# Patient Record
Sex: Female | Born: 1950
Health system: Southern US, Community
[De-identification: ages and names within clinical notes are randomized; demographics above are authoritative.]

## PROBLEM LIST (undated history)

## (undated) DIAGNOSIS — F32A Depression, unspecified: Secondary | ICD-10-CM

## (undated) DIAGNOSIS — M1990 Unspecified osteoarthritis, unspecified site: Secondary | ICD-10-CM

## (undated) DIAGNOSIS — F329 Major depressive disorder, single episode, unspecified: Secondary | ICD-10-CM

## (undated) HISTORY — DX: Depression, unspecified: F32.A

## (undated) HISTORY — DX: Major depressive disorder, single episode, unspecified: F32.9

## (undated) HISTORY — PX: CARPAL TUNNEL RELEASE: SHX101

## (undated) HISTORY — DX: Unspecified osteoarthritis, unspecified site: M19.90

---

## 1994-06-15 HISTORY — PX: ABDOMINAL HYSTERECTOMY: SHX81

## 2009-04-16 ENCOUNTER — Encounter: Payer: Self-pay | Admitting: Family Medicine

## 2009-05-01 ENCOUNTER — Encounter: Payer: Self-pay | Admitting: Family Medicine

## 2010-07-16 ENCOUNTER — Encounter: Payer: Self-pay | Admitting: Emergency Medicine

## 2010-07-16 ENCOUNTER — Ambulatory Visit (INDEPENDENT_AMBULATORY_CARE_PROVIDER_SITE_OTHER): Payer: Medicare Other | Admitting: Emergency Medicine

## 2010-07-16 DIAGNOSIS — J069 Acute upper respiratory infection, unspecified: Secondary | ICD-10-CM

## 2010-07-16 DIAGNOSIS — R5381 Other malaise: Secondary | ICD-10-CM

## 2010-07-16 DIAGNOSIS — R5383 Other fatigue: Secondary | ICD-10-CM | POA: Insufficient documentation

## 2010-07-16 LAB — CONVERTED CEMR LAB
ALT: 17 units/L (ref 0–35)
AST: 25 units/L (ref 0–37)
Basophils Absolute: 0.1 10*3/uL (ref 0.0–0.1)
Basophils Relative: 1 % (ref 0–1)
Creatinine, Ser: 0.69 mg/dL (ref 0.40–1.20)
Eosinophils Relative: 2 % (ref 0–5)
Hemoglobin: 13.4 g/dL (ref 12.0–15.0)
Lymphocytes Relative: 29 % (ref 12–46)
MCHC: 32.5 g/dL (ref 30.0–36.0)
Monocytes Absolute: 0.5 10*3/uL (ref 0.1–1.0)
Neutro Abs: 3.5 10*3/uL (ref 1.7–7.7)
Platelets: 137 10*3/uL — ABNORMAL LOW (ref 150–400)
RDW: 13.6 % (ref 11.5–15.5)
Sodium: 141 meq/L (ref 135–145)
Total Bilirubin: 0.3 mg/dL (ref 0.3–1.2)
Total Protein: 7.6 g/dL (ref 6.0–8.3)

## 2010-07-18 ENCOUNTER — Telehealth (INDEPENDENT_AMBULATORY_CARE_PROVIDER_SITE_OTHER): Payer: Self-pay | Admitting: *Deleted

## 2010-07-23 NOTE — Progress Notes (Signed)
  Phone Note Outgoing Call Call back at [facility] [phone]   Call placed by: Lajean Saver RN,  July 18, 2010 1:17 PM Call placed to: Patient Action Taken: Phone Call Completed Summary of Call: Callback: Patient reports she is feeling a lot better and is finishin g her antibiotics. I gave her her lab results

## 2010-07-23 NOTE — Assessment & Plan Note (Signed)
Summary: SICK, LOW GRADE FEVER/WB room 3   Vital Signs:  Patient profile:   60 year old female Height:      65 inches Weight:      296 pounds O2 Sat:      94 % on Room air Temp:     97.7 degrees F oral Pulse rate:   74 / minute Resp:     16 per minute BP sitting:   153 / 84  (left arm) Cuff size:   large  Vitals Entered By: Clemens Catholic LPN (July 16, 2010 11:15 AM)  O2 Flow:  Room air History of Present Illness History from: patient Chief Complaint: fatigue, URI symptoms History of Present Illness: 60 Years Old Female complains of onset of cold symptoms for 4-5 days.  [facility] has been using Goody powder which is helping a little bit. She doesn't have a PCP.  Her husband and sister died last year from lung CA and every time she gets sick, she gets anxious and is afraid to die.  She also has RA but doesn't take any meds.  She also c/o recent ringing in her L ear. + sore throat No cough No pleuritic pain No wheezing No nasal congestion + post-nasal drainage + sinus pain/pressure No chest congestion No itchy/red eyes No earache No hemoptysis No SOB + chills/sweats No fever No nausea No vomiting No abdominal pain No diarrhea No skin rashes + fatigue + myalgias No headache     Preventive Screening-Counseling & Management  Alcohol-Tobacco     Smoking Status: never      Drug Use:  no.    Current Medications (verified): 1)  None  Past History:  Past Medical History: RA  Past Surgical History: Caesarean section Carpal tunnel release Hysterectomy  Family History: RA  Diabetes  Social History: Never Smoked Alcohol use-no Drug use-no Smoking Status:  never Drug Use:  no  Physical Exam  General:  Well-developed,obese, using a cane; alert,appropriate and cooperative throughout examination Ears:  External ear exam shows no significant lesions or deformities.  Otoscopic examination reveals clear canals, tympanic membranes are intact bilaterally  without bulging, retraction, inflammation or discharge. Hearing is grossly normal bilaterally. Nose:  External nasal examination shows no deformity or inflammation. Nasal mucosa are pink and moist without lesions or exudates. Mouth:  Oral mucosa and oropharynx without lesions or exudates.  Teeth in good repair. Lungs:  Normal respiratory effort, chest expands symmetrically. Lungs are clear to auscultation, no crackles or wheezes. Heart:  Normal rate and regular rhythm. S1 and S2 normal without gallop, murmur, click, rub or other extra sounds. Psych:  Cognition and judgment appear intact. Alert and cooperative with normal attention span and concentration. No apparent delusions, illusions, hallucinations.  Occasionally during the exam, she almost becomes tearful when talking about her dead family members   Impression & Recommendations:  Problem # 1:  UPPER RESPIRATORY INFECTION (ICD-465.9) Rx for Amoxicillin.  Rest, hydration. I would like her to switch from [employer] (since I think it may be giving her tinnitus) to perhaps Mobic, but I will leave this to the discretion of her PCP.  We have scheduled an appt to establish at [facility]  Problem # 2:  FATIGUE (ICD-780.79) Will check labs.  With her body habitus, I am concerned with diabetes.  Likely also due to stress, anxiety, and URI.  Patient to follow up with PCP for lab results or if getting worse symptoms in the meantime. Orders: T-Comprehensive Metabolic Panel [id number])  T-CBC w/Diff [id number]) T-TSH [id number]) T-Hgb A1C ([id number])  Complete Medication List: 1)  Amoxicillin 875 Mg Tabs (Amoxicillin) .Marland Kitchen.. 1 tab by mouth two times a day for 7 days Prescriptions: AMOXICILLIN 875 MG TABS (AMOXICILLIN) 1 tab by mouth two times a day for 7 days  #14 x 0   Entered and Authorized by:   Hoyt Koch MD   Signed by:   Hoyt Koch MD on 07/16/2010   Method used:   Print then Give to Patient   RxID:    [id number]    Orders Added: 1)  New Patient Level III [44010] 2)  T-Comprehensive Metabolic Panel [[id number]] 3)  T-CBC w/Diff [[id number]] 4)  T-TSH [[id number]] 5)  T-Hgb A1C [83036-23375]

## 2010-07-24 ENCOUNTER — Other Ambulatory Visit: Payer: Self-pay | Admitting: Family Medicine

## 2010-07-24 ENCOUNTER — Ambulatory Visit (INDEPENDENT_AMBULATORY_CARE_PROVIDER_SITE_OTHER): Payer: Medicare Other | Admitting: Family Medicine

## 2010-07-24 ENCOUNTER — Ambulatory Visit
Admission: RE | Admit: 2010-07-24 | Discharge: 2010-07-24 | Disposition: A | Discharge status: Home / Self Care | Payer: Medicare Other | Source: Ambulatory Visit | Attending: Family Medicine | Admitting: Family Medicine

## 2010-07-24 ENCOUNTER — Encounter: Payer: Self-pay | Admitting: Family Medicine

## 2010-07-24 DIAGNOSIS — M2550 Pain in unspecified joint: Secondary | ICD-10-CM

## 2010-07-24 DIAGNOSIS — M25561 Pain in right knee: Secondary | ICD-10-CM

## 2010-07-24 DIAGNOSIS — M25569 Pain in unspecified knee: Secondary | ICD-10-CM

## 2010-07-24 DIAGNOSIS — F339 Major depressive disorder, recurrent, unspecified: Secondary | ICD-10-CM | POA: Insufficient documentation

## 2010-07-24 DIAGNOSIS — F329 Major depressive disorder, single episode, unspecified: Secondary | ICD-10-CM

## 2010-07-24 DIAGNOSIS — F331 Major depressive disorder, recurrent, moderate: Secondary | ICD-10-CM | POA: Insufficient documentation

## 2010-07-31 ENCOUNTER — Encounter: Payer: Self-pay | Admitting: Family Medicine

## 2010-07-31 NOTE — Assessment & Plan Note (Signed)
Summary: NOV: joint pain, right knee pain, new diagnosis depression   Vital Signs:  Patient profile:   60 year old female Height:      65 inches Weight:      302 pounds Pulse rate:   61 / minute BP sitting:   134 / 66  (right arm) Cuff size:   large  Vitals Entered By: Avon Gully CMA, Duncan Dull) (July 24, 2010 2:09 PM) CC: NP-est care   CC:  NP-est care.  History of Present Illness: Was on meloxicam (inc her BP), then IBU adn din't help her pain.  Han't been on anything for over a year. Using OTC meds now.  Mother with hx of RA and sister with RA.  Right hip and right knee pain.       Going through menopause.  Hx of complete hysterectomy.  Hot flashes, anxiety, Not sleeping well, irritable and depressed alot.  Tooks HRT for about a year.    Husban passed away last year. has alot of family support. He died from Lung Cancer.    Has been trying to loose weight.   Habits & Providers  Alcohol-Tobacco-Diet     Alcohol drinks/day: 0     Tobacco Status: quit     Year Quit: 2008  Exercise-Depression-Behavior     Does Patient Exercise: no     STD Risk: never     Drug Use: never     Seat Belt Use: always  Current Medications (verified): 1)  Aleve 220 Mg Tabs (Naproxen Sodium)  Allergies (verified): No Known Drug Allergies  Comments:  Nurse/Medical Assistant: The patient's medications and allergies were reviewed with the patient and were updated in the Medication and Allergy Lists. Avon Gully CMA, Duncan Dull) (July 24, 2010 2:11 PM)  Past History:  Past Surgical History: Caesarean section x 2  Carpal tunnel release, right and left.  Hysterectomy 1996 for fibroids   Social History: Reitred Optometrist.  Alcohol use-no Drug use-no Former Smoker, smoked for 2 years, 1ppd  Smoking Status:  quit Does Patient Exercise:  no STD Risk:  never Drug Use:  never Risk analyst Use:  always  Physical Exam  General:  Well-developed,well-nourished,in no acute  distress; alert,appropriate and cooperative throughout examination Head:  Normocephalic and atraumatic without obvious abnormalities. No apparent alopecia or balding. Neck:  No deformities, masses, or tenderness noted. Lungs:  Normal respiratory effort, chest expands symmetrically. Lungs are clear to auscultation, no crackles or wheezes. Heart:  Normal rate and regular rhythm. S1 and S2 normal without gallop, murmur, click, rub or other extra sounds. Msk:  noted I did not examine her right knee today. Skin:  no rashes.   Cervical Nodes:  No lymphadenopathy noted Psych:  Cognition and judgment appear intact. Alert and cooperative with normal attention span and concentration. No apparent delusions, illusions, hallucinations   Impression & Recommendations:  Problem # 1:  POLYARTHRITIS (ICD-719.49) I am not sure that she actually has rheumatoid arthritis based on her history.  She mostly complains of her right knee and right hip.  She said previously she was told the cartilage which was worn out.  She was supposed to follow-up with the orthopedist but the week of her appointment she found out she was losing her job at the point that she worked out at the time. she does not have any swelling or significant pain today in her right knee the is a little sore.  We will get an x-ray of the right knee today  and refer her to orthopedics for further evaluation.  Certainly we can also check a rheumatoid factor and a sed rate at the suspicion is low for rheumatoid as she has no complaints of joint pain or swelling or redness in her hands.  Problem # 2:  KNEE PAIN, RIGHT (ICD-719.46) will get an x-ray today and refer to orthopedics for further evaluation.  She may be a candidate for injections.  Also send of her prescription for Relafen for pain relief since the counter products are not enough. Her updated medication list for this problem includes:    Aleve 220 Mg Tabs (Naproxen sodium)    Nabumetone 500 Mg Tabs  (Nabumetone) .Marland Kitchen... Take 1 tablet by mouth two times a day as needed for joint pain  Orders: T-DG Knee 2 Views*R* (54098) Orthopedic Referral (Ortho)  Problem # 3:  DEPRESSION (ICD-311) PHQ-9 score of 21 (severe).  discussed her diagnosis and I recommended a medication in addition to counseling or therapy.  She feels her family is very supportive but is interested in trying medication for symptom relief.  I explained that she will still agree for her husband and sister but were trying to improve her sleep quality and her irritability and overalldepression.  Restart with citalopram and follow-up in 3 weeks.  Discussed potential side effects.  Call with any concerns or thoughts of hurting herself.  She currently denies any suicidal thoughts. Her updated medication list for this problem includes:    Citalopram Hydrobromide 20 Mg Tabs (Citalopram hydrobromide) .Marland Kitchen... 1/2 tab by mouth daily for the first week then inc to whole tab daily  Complete Medication List: 1)  Aleve 220 Mg Tabs (Naproxen sodium) 2)  Citalopram Hydrobromide 20 Mg Tabs (Citalopram hydrobromide) .... 1/2 tab by mouth daily for the first week then inc to whole tab daily 3)  Nabumetone 500 Mg Tabs (Nabumetone) .... Take 1 tablet by mouth two times a day as needed for joint pain   Patient Instructions: 1)  We will call you with your xray results.  2)  Please schedule a follow-up appointment in 3 weeks for mood.  Prescriptions: NABUMETONE 500 MG TABS (NABUMETONE) Take 1 tablet by mouth two times a day as needed for joint pain  #60 x 1   Entered and Authorized by:   Nani Gasser MD   Signed by:   Nani Gasser MD on 07/25/2010   Method used:   Electronically to        [facility] [phone]* (retail)       [address]       [place], [place]  [address]       Ph: [phone]       Fax: [phone]   RxID:   [id number] CITALOPRAM HYDROBROMIDE 20 MG TABS (CITALOPRAM HYDROBROMIDE) 1/2 tab by mouth daily for the  first week then inc to whole tab daily  #30 x 0   Entered and Authorized by:   Nani Gasser MD   Signed by:   Nani Gasser MD on 07/24/2010   Method used:   Electronically to        [facility] [phone]* (retail)       [address]       [place], [place]  [address]       Ph: [phone]       Fax: [phone]   RxID:   [id number]    Orders Added: 1)  T-DG Knee 2 Views*R* [73560] 2)  Orthopedic Referral [Ortho] 3)  New Patient Level III [99203]  Appended Document: NOV: joint pain, right knee pain, new diagnosis depression Append HPI: Sister also passed away this year. She has been strugglign with her mood. Has not been sleeping well. Has been more irritable. Even her kids have noticed her being less patient. Says she is really mouring their loss. Tearful at times.

## 2010-08-12 NOTE — Letter (Signed)
Summary: Intake Forms  Intake Forms   Imported By: Lanelle Bal 08/07/2010 11:33:49  _____________________________________________________________________  External Attachment:    Type:   Image     Comment:   External Document

## 2010-08-12 NOTE — Letter (Signed)
Summary: Patient Health Questionnaire  Patient Health Questionnaire   Imported By: Maryln Gottron 08/04/2010 13:50:28  _____________________________________________________________________  External Attachment:    Type:   Image     Comment:   External Document

## 2010-08-14 HISTORY — PX: TOTAL KNEE ARTHROPLASTY: SHX125

## 2010-08-18 ENCOUNTER — Ambulatory Visit (HOSPITAL_COMMUNITY)
Admission: RE | Admit: 2010-08-18 | Discharge: 2010-08-18 | Disposition: A | Discharge status: Home / Self Care | Payer: Medicare Other | Source: Ambulatory Visit | Attending: Orthopedic Surgery | Admitting: Orthopedic Surgery

## 2010-08-18 ENCOUNTER — Encounter (HOSPITAL_COMMUNITY)
Admission: RE | Admit: 2010-08-18 | Discharge: 2010-08-18 | Disposition: A | Discharge status: Home / Self Care | Payer: Medicare Other | Source: Ambulatory Visit | Attending: Orthopedic Surgery | Admitting: Orthopedic Surgery

## 2010-08-18 ENCOUNTER — Other Ambulatory Visit (HOSPITAL_COMMUNITY): Payer: Self-pay | Admitting: Orthopedic Surgery

## 2010-08-18 DIAGNOSIS — M1711 Unilateral primary osteoarthritis, right knee: Secondary | ICD-10-CM

## 2010-08-18 DIAGNOSIS — Z01812 Encounter for preprocedural laboratory examination: Secondary | ICD-10-CM | POA: Insufficient documentation

## 2010-08-18 DIAGNOSIS — IMO0002 Reserved for concepts with insufficient information to code with codable children: Secondary | ICD-10-CM | POA: Insufficient documentation

## 2010-08-18 DIAGNOSIS — Z01818 Encounter for other preprocedural examination: Secondary | ICD-10-CM | POA: Insufficient documentation

## 2010-08-18 DIAGNOSIS — M1710 Unilateral primary osteoarthritis, unspecified knee: Secondary | ICD-10-CM | POA: Insufficient documentation

## 2010-08-18 DIAGNOSIS — Z01810 Encounter for preprocedural cardiovascular examination: Secondary | ICD-10-CM | POA: Insufficient documentation

## 2010-08-18 DIAGNOSIS — I517 Cardiomegaly: Secondary | ICD-10-CM | POA: Insufficient documentation

## 2010-08-18 LAB — COMPREHENSIVE METABOLIC PANEL
ALT: 24 U/L (ref 0–35)
AST: 25 U/L (ref 0–37)
Albumin: 4.1 g/dL (ref 3.5–5.2)
Alkaline Phosphatase: 74 U/L (ref 39–117)
CO2: 24 mEq/L (ref 19–32)
Chloride: 105 mEq/L (ref 96–112)
Creatinine, Ser: 0.82 mg/dL (ref 0.4–1.2)
GFR calc Af Amer: >60 mL/min (ref >60)
GFR calc non Af Amer: >60 mL/min (ref >60)
Potassium: 3.6 mEq/L (ref 3.5–5.1)
Sodium: 140 mEq/L (ref 135–145)
Total Bilirubin: 0.4 mg/dL (ref 0.3–1.2)

## 2010-08-18 LAB — CBC
HCT: 43.2 % (ref 36.0–46.0)
Hemoglobin: 14.8 g/dL (ref 12.0–15.0)
MCH: 31.6 pg (ref 26.0–34.0)
MCHC: 34.3 g/dL (ref 30.0–36.0)
MCV: 92.1 fL (ref 78.0–100.0)
RBC: 4.69 MIL/uL (ref 3.87–5.11)

## 2010-08-18 LAB — APTT: aPTT: 38 seconds — ABNORMAL HIGH (ref 24–37)

## 2010-08-20 ENCOUNTER — Inpatient Hospital Stay (HOSPITAL_COMMUNITY): Payer: Medicare Other

## 2010-08-20 ENCOUNTER — Inpatient Hospital Stay (HOSPITAL_COMMUNITY)
Admission: RE | Admit: 2010-08-20 | Discharge: 2010-08-24 | DRG: 470 | Disposition: A | Discharge status: Home Health | Payer: Medicare Other | Source: Ambulatory Visit | Attending: Orthopedic Surgery | Admitting: Orthopedic Surgery

## 2010-08-20 DIAGNOSIS — K219 Gastro-esophageal reflux disease without esophagitis: Secondary | ICD-10-CM | POA: Diagnosis present

## 2010-08-20 DIAGNOSIS — Z87891 Personal history of nicotine dependence: Secondary | ICD-10-CM

## 2010-08-20 DIAGNOSIS — F3289 Other specified depressive episodes: Secondary | ICD-10-CM | POA: Diagnosis present

## 2010-08-20 DIAGNOSIS — R079 Chest pain, unspecified: Secondary | ICD-10-CM

## 2010-08-20 DIAGNOSIS — M1710 Unilateral primary osteoarthritis, unspecified knee: Principal | ICD-10-CM | POA: Diagnosis present

## 2010-08-20 DIAGNOSIS — F329 Major depressive disorder, single episode, unspecified: Secondary | ICD-10-CM | POA: Diagnosis present

## 2010-08-20 LAB — TROPONIN I: Troponin I: 0.03 ng/mL (ref 0.00–0.06)

## 2010-08-20 LAB — TYPE AND SCREEN: ABO/RH(D): O NEG

## 2010-08-20 LAB — CK TOTAL AND CKMB (NOT AT ARMC)
Relative Index: 1.5 (ref 0.0–2.5)
Total CK: 218 U/L — ABNORMAL HIGH (ref 7–177)

## 2010-08-20 LAB — CARDIAC PANEL(CRET KIN+CKTOT+MB+TROPI): Relative Index: 2 (ref 0.0–2.5)

## 2010-08-20 LAB — ABO/RH: ABO/RH(D): O NEG

## 2010-08-21 DIAGNOSIS — R079 Chest pain, unspecified: Secondary | ICD-10-CM

## 2010-08-21 LAB — CARDIAC PANEL(CRET KIN+CKTOT+MB+TROPI)
Relative Index: 1.5 (ref 0.0–2.5)
Total CK: 200 U/L — ABNORMAL HIGH (ref 7–177)
Troponin I: 0.03 ng/mL (ref 0.00–0.06)

## 2010-08-21 LAB — PROTIME-INR: INR: 1.12 (ref 0.00–1.49)

## 2010-08-21 LAB — BASIC METABOLIC PANEL
CO2: 26 mEq/L (ref 19–32)
Chloride: 102 mEq/L (ref 96–112)
GFR calc Af Amer: >60 mL/min (ref >60)
Potassium: 3.8 mEq/L (ref 3.5–5.1)

## 2010-08-21 LAB — CBC
HCT: 27.4 % — ABNORMAL LOW (ref 36.0–46.0)
Hemoglobin: 9.2 g/dL — ABNORMAL LOW (ref 12.0–15.0)
MCHC: 33.6 g/dL (ref 30.0–36.0)
MCV: 93.5 fL (ref 78.0–100.0)

## 2010-08-21 NOTE — Consult Note (Signed)
Summary: [facility]   Imported By: Lanelle Bal 08/15/2010 09:36:06  _____________________________________________________________________  External Attachment:    Type:   Image     Comment:   External Document

## 2010-08-22 LAB — PROTIME-INR
INR: 1.6 — ABNORMAL HIGH (ref 0.00–1.49)
Prothrombin Time: 19.2 seconds — ABNORMAL HIGH (ref 11.6–15.2)

## 2010-08-22 LAB — BASIC METABOLIC PANEL
Calcium: 7.9 mg/dL — ABNORMAL LOW (ref 8.4–10.5)
Creatinine, Ser: 0.73 mg/dL (ref 0.4–1.2)
GFR calc Af Amer: >60 mL/min (ref >60)
GFR calc non Af Amer: >60 mL/min (ref >60)

## 2010-08-22 LAB — CBC
Platelets: 102 10*3/uL — ABNORMAL LOW (ref 150–400)
RDW: 13.2 % (ref 11.5–15.5)
WBC: 8.5 10*3/uL (ref 4.0–10.5)

## 2010-08-23 LAB — CBC
MCH: 30.1 pg (ref 26.0–34.0)
Platelets: 106 10*3/uL — ABNORMAL LOW (ref 150–400)
RBC: 2.66 MIL/uL — ABNORMAL LOW (ref 3.87–5.11)
WBC: 7.6 10*3/uL (ref 4.0–10.5)

## 2010-08-23 LAB — BASIC METABOLIC PANEL
Calcium: 8.3 mg/dL — ABNORMAL LOW (ref 8.4–10.5)
GFR calc Af Amer: >60 mL/min (ref >60)
GFR calc non Af Amer: >60 mL/min (ref >60)
Glucose, Bld: 135 mg/dL — ABNORMAL HIGH (ref 70–99)
Sodium: 138 mEq/L (ref 135–145)

## 2010-08-23 LAB — PROTIME-INR: Prothrombin Time: 22.5 seconds — ABNORMAL HIGH (ref 11.6–15.2)

## 2010-08-24 LAB — CBC
Hemoglobin: 7.7 g/dL — ABNORMAL LOW (ref 12.0–15.0)
MCHC: 32.8 g/dL (ref 30.0–36.0)

## 2010-08-24 LAB — PROTIME-INR
INR: 1.87 — ABNORMAL HIGH (ref 0.00–1.49)
Prothrombin Time: 21.7 seconds — ABNORMAL HIGH (ref 11.6–15.2)

## 2010-08-31 ENCOUNTER — Encounter: Payer: Self-pay | Admitting: Family Medicine

## 2010-08-31 NOTE — Op Note (Signed)
Terri Farrell, Terri Farrell               ACCOUNT NO.:  [id number]  MEDICAL RECORD NO.:  [id number]           PATIENT TYPE:  I  LOCATION:  3311                         FACILITY:  [facility]  PHYSICIAN:  Nadara Mustard, MD     DATE OF BIRTH:  05-07-1951  DATE OF PROCEDURE: DATE OF DISCHARGE:                              OPERATIVE REPORT   PREOPERATIVE DIAGNOSIS:  Osteoarthritis, right knee.  POSTOPERATIVE DIAGNOSIS:  Osteoarthritis, right knee.  PROCEDURE:  Right total knee arthroplasty with Zimmer components, size E femur, 5 tibia, 20-mm poly tray, 32-mm patella.  SURGEON:  Nadara Mustard, MD  ANESTHESIA:  General plus femoral block.  ESTIMATED BLOOD LOSS:  Minimal.  ANTIBIOTICS:  Kefzol 2 g.  DRAINS:  None.  COMPLICATIONS:  None.  TOURNIQUET TIME:  None.  DISPOSITION:  To PACU in stable condition.  INDICATIONS FOR PROCEDURE:  The patient is a 60 year old woman with osteoarthritis of her right knee who presents at this time for total knee arthroplasty.  The patient has pain with activities of daily living.  She has failed conservative care.  Risks and benefits of surgery were discussed including infection, neurovascular injury, persistent pain, DVT, pulmonary embolus, need for additional surgery. The patient states she understands and wish proceed at this time.  The patient has a BMI greater than 50 and extra time was needed to perform the procedure due to the increased BMI.  DESCRIPTION OF PROCEDURE:  The patient was brought to OR room 10 and underwent a general anesthetic.  After adequate level of the anesthesia was obtained, the patient's right lower extremity was prepped using DuraPrep, draped in a sterile field.  An Collier Flowers was used to cover all exposed skin.  A medial incision was made.  This was carried down with a medial parapatellar retinacular incision.  The femur was drilled.  IM guide was used, set for 3 degrees of valgus and 12 mm was taken off the distal femur  due to the need to resect part of the lateral femoral condyle.  This was then measured for a size E and then chamfer cuts were made for the size E femur.  Attention was focused on the tibia.  This was set for 7 degrees posterior slope, neutral varus and valgus.  The cutting block was placed and this was set to take 10 mm off the medial tibial plateau barely resecting any bone from the lateral tibial plateau.  This then sized for a size 5, and the drill hole was made for the small stem.  Attention was then focused back on the femur.  The femoral trial was placed.  Box cut was made for the femur.  The femoral trial and tibia was then tried and this had stable full extension with stable varus and valgus with a 20-mm poly tray.  The lug cuts were drilled for the femur.  The patella was resurfaced and 10 mm was taken off the patella, sized for a 32, and the lug cuts were made for the 32 patella.  The knee was irrigated with pulsatile lavage, 3 L, total of 60 mL was injected into the  popliteal fossa.  Care was taken not to make an intervascular injection.  The wound was again irrigated with pulsatile lavage.  The tibial component was cemented followed by the femoral component.  Loose cement was removed.  The poly tray was placed and screwed in place and properly torqued.  The knee was left in extension. The patella was placed and the clamp was placed in the patella until the cement had hardened.  After the cement had hardened, knee was placed through a full range of motion, the patella tracked in midline.  The retinacular incision was closed using #1 Vicryl, subcu was closed using 0 Vicryl, skin was closed using approximating staples.  Wound was covered Adaptic orthopedic sponges, ABD dressing, Webril, and Coban. The patient was extubated and taken to PACU in stable condition.     Nadara Mustard, MD     MVD/MEDQ  D:  08/20/2010  T:  08/20/2010  Job:  [id number]  Electronically Signed by  Aldean Baker MD on 08/31/2010 04:34:06 PM

## 2010-09-03 ENCOUNTER — Other Ambulatory Visit: Payer: Self-pay | Admitting: Orthopedic Surgery

## 2010-09-03 DIAGNOSIS — Z96659 Presence of unspecified artificial knee joint: Secondary | ICD-10-CM

## 2010-09-03 NOTE — Consult Note (Signed)
Terri Farrell, Terri Farrell               ACCOUNT NO.:  [id number]  MEDICAL RECORD NO.:  [id number]           PATIENT TYPE:  I  LOCATION:  3311                         FACILITY:  [facility]  PHYSICIAN:  Terri Can. Abisai Farrell, M.D.DATE OF BIRTH:  1950/09/29  DATE OF CONSULTATION:  08/20/2010 DATE OF DISCHARGE:                                CONSULTATION   PRIMARY CARDIOLOGIST:  New patient to [facility], currently being evaluated by Dr. Delainee Farrell.  PRIMARY CARE PHYSICIAN:  Nani Gasser, MD  REASON FOR CONSULTATION:  Chest pain.  HISTORY OF PRESENT ILLNESS:  This is a 60 year old female who we were asked to evaluate in the PACU who is status post right total knee repair with complaints of chest pain postoperatively.  The patient states that she feels like she needs to belch.  She states that the feeling comes on intermittently and lasts several seconds and resolves on its own.  The pain is located under her left breast.  She denies any radiation.  She denies any nausea, vomiting, diaphoresis, or shortness of breath.  She states she has had the pain previously that she associates with acid reflux after she eats certain foods.  The patient states prior to the operation she was not very mobile, but states she could do her day-to- day activities without difficulty.  She denies any increased dyspnea, orthopnea, or PND.  She does state she has bilateral lower extremity edema occasionally.  The patient denies any history of hypertension, hyperlipidemia, or diabetes mellitus.  She denies any prior cardiac workup.  Her EKG does show anterolateral T-wave inversions.  During evaluation, the patient is currently pain free.  Cardiac markers are pending.  Cardiology was asked to evaluate the patient further.  PAST MEDICAL HISTORY: 1. Osteoarthritis. 2. Depression. 3. Gastroesophageal reflux disease. 4. Status post carpal tunnel bilaterally. 5. Status post C-section x2.  SOCIAL HISTORY:  The  patient is a retired Consulting civil engineer.  She is widowed within the last year.  She has 2 children.  She has a 20-pack- year smoking history, but quit approximately 4 years ago.  She denies any alcohol or illicit drug use.  FAMILY HISTORY:  Noncontributory for early coronary artery disease, although her father has started to have heart problems in his 24s.  She has siblings with diabetes mellitus.  ALLERGIES:  No known drug allergies.  MEDICATIONS: 1. Citalopram 20 mg daily. 2. Nabumetone 500 mg twice daily as needed for pain.  REVIEW OF SYSTEMS:  All pertinent positives and negatives as well as right knee pain as stated in the HPI.  All other systems have been reviewed and are negative.  PHYSICAL EXAMINATION:  VITAL SIGNS:  Pulse 61, respirations 15, blood pressure 113/64, and O2 saturation 97% on 2 liters. GENERAL:  This is a polite, well-developed, well-nourished, middle-aged female.  She is in no acute distress. HEENT:  Normal. NECK:  Supple without JVD. HEART:  Regular rate and rhythm with S1 and S2.  No murmur, rub, or gallop noted.  PMI is normal.  No tenderness to palpation.  Pulses are 2+ and equal bilaterally. LUNGS:  Clear to auscultation anteriorly without  wheezes, rales, or rhonchi. ABDOMEN:  Soft, obese, and nontender.  Positive bowel sounds x4. EXTREMITIES:  No clubbing, cyanosis, or edema.  There is right lower extremity in a brace. MUSCULOSKELETAL:  No joint deformities or effusions. NEURO:  Alert and oriented x3.  Cranial nerves II-XII grossly intact.  EKG showing sinus bradycardia at a rate of 54 beats per minute.  There is anterolateral T-wave inversions that are new from prior tracing.  LABORATORY DATA:  Pending.  ASSESSMENT/PLAN:  This is a 60 year old female who is status post right total knee who presents postoperatively with chest pain and new anterolateral T-wave changes.  The patient is morbidly obese, but denies hypertension, hyperlipidemia, or  diabetes mellitus.  With her EKG changes, the patient will be ruled out for myocardial infarction.  She will be transferred to the [facility] for overnight duration.  We will check serial enzymes and EKG.  We will add aspirin.  We will also start nitroglycerin drip. Currently, we will hold initiation of heparin, pending ortho recommendations as well as cardiac marker results.  At this time, we will also hold off on any beta-blocker initiation as the patient is bradycardic. Further treatments will be dependent upon the above results.     Terri Monarch, PA-C   ______________________________ Terri Farrell, M.D.    NB/MEDQ  D:  08/20/2010  T:  08/21/2010  Job:  [id number]  Electronically Signed by Alen Blew P.A. on 08/21/2010 06:07:14 PM Electronically Signed by Roger Shelter M.D. on 09/03/2010 04:16:47 PM

## 2010-09-04 ENCOUNTER — Ambulatory Visit
Admission: RE | Admit: 2010-09-04 | Discharge: 2010-09-04 | Disposition: A | Discharge status: Home / Self Care | Payer: Medicare Other | Source: Ambulatory Visit | Attending: Orthopedic Surgery | Admitting: Orthopedic Surgery

## 2010-09-04 ENCOUNTER — Ambulatory Visit (INDEPENDENT_AMBULATORY_CARE_PROVIDER_SITE_OTHER): Payer: Medicare Other | Admitting: Family Medicine

## 2010-09-04 ENCOUNTER — Encounter: Payer: Self-pay | Admitting: Family Medicine

## 2010-09-04 DIAGNOSIS — Z96659 Presence of unspecified artificial knee joint: Secondary | ICD-10-CM

## 2010-09-04 DIAGNOSIS — F329 Major depressive disorder, single episode, unspecified: Secondary | ICD-10-CM

## 2010-09-04 DIAGNOSIS — D649 Anemia, unspecified: Secondary | ICD-10-CM

## 2010-09-04 DIAGNOSIS — R5383 Other fatigue: Secondary | ICD-10-CM

## 2010-09-04 NOTE — Assessment & Plan Note (Signed)
Almost completed iron. Will recheck CBC and iron level next week.

## 2010-09-04 NOTE — Patient Instructions (Signed)
Go to lab next week to recheck anemia and iron levels.  Call me if you feel your mood is declining.

## 2010-09-04 NOTE — Assessment & Plan Note (Addendum)
PHQ-9 score of 4.  She is not on the citalopram for 2.5 weeks. She is doing fantastic. I think her depression has really improved since her surgery.  Asked her to call if any sign of decline in mood

## 2010-09-04 NOTE — Progress Notes (Signed)
  Subjective:    Patient ID: Terri Farrell, female    DOB: 10/26/1950, 59 y.o.   MRN: [id number]  HPI Roger Shelter well post surgery.  No complications. Painis much better. Doing PT.  Mood has been good. Ws anemic post surgery so was startedon iron. Only has 2 days more to take and will complete her rx given by ortho.     Review of Systems     Objective:   Physical Exam  Cardiovascular: Normal rate, regular rhythm and normal heart sounds.   Pulmonary/Chest: Effort normal and breath sounds normal. No respiratory distress. She has no wheezes.  She is using a cane to walk today.      BP 112/63  Pulse 69  Ht 5\' 5"  (1.651 m)  Wt 304 lb (137.893 kg)  BMI 50.59 kg/m2    Assessment & Plan:  1. Anemia post surgery. Complete iron dose and recheck in one week.

## 2010-09-11 LAB — CBC
HCT: 33.9 % — ABNORMAL LOW (ref 36.0–46.0)
Hemoglobin: 11 g/dL — ABNORMAL LOW (ref 12.0–15.0)
MCH: 30.6 pg (ref 26.0–34.0)
MCV: 94.5 fL (ref 78.0–100.0)
RBC: 3.59 MIL/uL — ABNORMAL LOW (ref 3.87–5.11)
WBC: 5.5 10*3/uL (ref 4.0–10.5)

## 2010-09-12 ENCOUNTER — Telehealth: Payer: Self-pay | Admitting: Family Medicine

## 2010-09-12 ENCOUNTER — Encounter: Payer: Self-pay | Admitting: Emergency Medicine

## 2010-09-12 ENCOUNTER — Inpatient Hospital Stay (INDEPENDENT_AMBULATORY_CARE_PROVIDER_SITE_OTHER)
Admission: RE | Admit: 2010-09-12 | Discharge: 2010-09-12 | Disposition: A | Discharge status: Home / Self Care | Payer: Medicare Other | Source: Ambulatory Visit | Attending: Emergency Medicine | Admitting: Emergency Medicine

## 2010-09-12 DIAGNOSIS — F329 Major depressive disorder, single episode, unspecified: Secondary | ICD-10-CM

## 2010-09-12 DIAGNOSIS — R5383 Other fatigue: Secondary | ICD-10-CM

## 2010-09-12 LAB — IRON AND TIBC
%SAT: 13 % — ABNORMAL LOW (ref 20–55)
TIBC: 293 ug/dL (ref 250–470)

## 2010-09-12 NOTE — Telephone Encounter (Signed)
Pt. Notified and voiced understanding.

## 2010-09-12 NOTE — Telephone Encounter (Signed)
Call pt: Hgb is back up to 11.0. Continue iron once a day and lets recheck in 8 weeks.  Looks much better.

## 2010-09-16 NOTE — Assessment & Plan Note (Signed)
Summary: sob/nausea Room 5   Vital Signs:  Patient Profile:   60 Years Old Female CC:      SOB, nausea, tired, x 3 wks after knee replacement surgery Height:     65 inches Weight:      302 pounds O2 Sat:      95 % O2 treatment:    Room Air Temp:     97.7 degrees F oral Pulse rate:   86 / minute Pulse rhythm:   regular Resp:     20 per minute BP sitting:   166 / 80  (left arm) Cuff size:   large  Vitals Entered By: Emilio Math (September 12, 2010 2:09 PM)                  Current Allergies: No known allergies History of Present Illness History from: patient Chief Complaint: SOB, nausea, tired, x 3 wks after knee replacement surgery History of Present Illness: Mild SOB, tired, fatigue, worn out, low energy, feeling cold, depressive symptoms for a weeks weeks.  She states that she had R knee TKR and stopped her Celexa and her Iron pills prior to surgery and never restarted them.  She was asked by the ortho surgeon if she wanted a blood transfusion prior to hospital d/c but she declined.  No CP, diaphoresis. She had labs drawn yesterday by her PCP.   She is healing well from her knee replacement and feels very happy about that now that she can move around much better and be in much less pain.  No suicidal thoughts.  REVIEW OF SYSTEMS Constitutional Symptoms       Complains of night sweats.     Denies fever, chills, weight loss, weight gain, and fatigue.  Eyes       Denies change in vision, eye pain, eye discharge, glasses, contact lenses, and eye surgery. Ear/Nose/Throat/Mouth       Denies hearing loss/aids, change in hearing, ear pain, ear discharge, dizziness, frequent runny nose, frequent nose bleeds, sinus problems, sore throat, hoarseness, and tooth pain or bleeding.  Respiratory       Complains of shortness of breath.      Denies dry cough, productive cough, wheezing, asthma, bronchitis, and emphysema/COPD.  Cardiovascular       Complains of tires easily with exhertion.       Denies murmurs and chest pain.    Gastrointestinal       Complains of nausea/vomiting.      Denies stomach pain, diarrhea, constipation, blood in bowel movements, and indigestion. Genitourniary       Denies painful urination, kidney stones, and loss of urinary control. Neurological       Denies paralysis, seizures, and fainting/blackouts. Musculoskeletal       Denies muscle pain, joint pain, joint stiffness, decreased range of motion, redness, swelling, muscle weakness, and gout.  Skin       Denies bruising, unusual mles/lumps or sores, and hair/skin or nail changes.  Psych       Denies mood changes, temper/anger issues, anxiety/stress, speech problems, depression, and sleep problems.  Past History:  Past Medical History: Reviewed history from 07/16/2010 and no changes required. RA  Past Surgical History: Caesarean section x 2  Carpal tunnel release, right and left.  Hysterectomy 1996 for fibroids  Total knee replacement  Family History: Reviewed history from 07/16/2010 and no changes required. RA  Diabetes  Social History: Reviewed history from 07/24/2010 and no changes required. [employer].  Alcohol  use-no Drug use-no Former Smoker, smoked for 2 years, 1ppd Physical Exam General appearance: well developed, well nourished, no acute distress, using a cane to ambulate Chest/Lungs: no rales, wheezes, or rhonchi bilateral, breath sounds equal without effort Heart: regular rate and  rhythm, no murmur Extremities: deferred MSE: oriented to time, place, and person.  Affect is normal.  Mood appears normal.  Plan New Medications/Changes: FERROUS SULFATE 325 (65 FE) MG TABS (FERROUS SULFATE) 1 by mouth two times a day  #60 x 0, 09/12/2010, Hoyt Koch MD CITALOPRAM HYDROBROMIDE 20 MG TABS (CITALOPRAM HYDROBROMIDE) 1/2 tab daily for the first week, then increase to 1 tab daily  #30 x 0, 09/12/2010, Hoyt Koch MD  New Orders: Est. Patient Level IV  [81191] Pulse Oximetry (single measurment) [47829] Planning Comments:   I went over her labs with her PCP and was decided to get her back on Iron and her antidepressant.  Pt doesn't know where her Celexa Rx is so I will write again for that as well.  She will need to see her PCP again in 1-2 weeks, then repeat labs perhaps in 8 weeks.  I feel that her symptoms are solely from her iron deficiency anemia as well as depression.  Patient understands and agrees. Follow-up with your primary care physician if not improving or if getting worse in the meantime.   The patient and/or caregiver has been counseled thoroughly with regard to medications prescribed including dosage, schedule, interactions, rationale for use, and possible side effects and they verbalize understanding.  Diagnoses and expected course of recovery discussed and will return if not improved as expected or if the condition worsens. Patient and/or caregiver verbalized understanding.  Prescriptions: FERROUS SULFATE 325 (65 FE) MG TABS (FERROUS SULFATE) 1 by mouth two times a day  #60 x 0   Entered and Authorized by:   Hoyt Koch MD   Signed by:   Hoyt Koch MD on 09/12/2010   Method used:   Print then Give to Patient   RxID:   [id number] CITALOPRAM HYDROBROMIDE 20 MG TABS (CITALOPRAM HYDROBROMIDE) 1/2 tab daily for the first week, then increase to 1 tab daily  #30 x 0   Entered and Authorized by:   Hoyt Koch MD   Signed by:   Hoyt Koch MD on 09/12/2010   Method used:   Print then Give to Patient   RxID:   [id number]   Orders Added: 1)  Est. Patient Level IV [53664] 2)  Pulse Oximetry (single measurment) [40347]

## 2010-09-26 ENCOUNTER — Encounter: Payer: Self-pay | Admitting: Family Medicine

## 2010-10-02 ENCOUNTER — Telehealth: Payer: Self-pay | Admitting: Family Medicine

## 2010-10-02 ENCOUNTER — Encounter: Payer: Self-pay | Admitting: Family Medicine

## 2010-10-02 ENCOUNTER — Ambulatory Visit (INDEPENDENT_AMBULATORY_CARE_PROVIDER_SITE_OTHER): Payer: Medicare Other | Admitting: Family Medicine

## 2010-10-02 DIAGNOSIS — F329 Major depressive disorder, single episode, unspecified: Secondary | ICD-10-CM

## 2010-10-02 DIAGNOSIS — M2550 Pain in unspecified joint: Secondary | ICD-10-CM

## 2010-10-02 DIAGNOSIS — D509 Iron deficiency anemia, unspecified: Secondary | ICD-10-CM | POA: Insufficient documentation

## 2010-10-02 DIAGNOSIS — D649 Anemia, unspecified: Secondary | ICD-10-CM

## 2010-10-02 DIAGNOSIS — F3289 Other specified depressive episodes: Secondary | ICD-10-CM

## 2010-10-02 LAB — CBC
HCT: 40.9 % (ref 36.0–46.0)
Hemoglobin: 13 g/dL (ref 12.0–15.0)
MCHC: 31.8 g/dL (ref 30.0–36.0)
WBC: 7.1 10*3/uL (ref 4.0–10.5)

## 2010-10-02 LAB — IRON: Iron: 44 ug/dL (ref 42–145)

## 2010-10-02 LAB — VITAMIN B12: Vitamin B-12: 263 pg/mL (ref 211–911)

## 2010-10-02 NOTE — Progress Notes (Signed)
  Subjective:    Patient ID: Terri Farrell, female    DOB: 1951-01-02, 60 y.o.   MRN: [id number]  HPI Had her surgery for right full knee replaecment on 08/20/10.   Saw ortho this AM and he didn't say wether or not to refill her coumadin. Dr. Lajoyce Corners. She was supposed to be on it for 6 weeks.    She was getting nausated so quit taing medications for the mood swings, citalopram and stopped her iron for anemia as well. She was off the pain medication at that time. She also wanted to sleep all day long as well. Feels her mood is some better. Feels she is not depressed.  Both parents are in the hospital which has been stressful.  She thinks she would like to see how she does off of mood meds.    She takes goodies fo pain relief. Says they works well. She no longer has nausea, or heartburn.    Review of Systems     Objective:   Physical Exam  Constitutional: She appears well-developed and well-nourished.  HENT:  Head: Normocephalic and atraumatic.  Cardiovascular: Normal rate, regular rhythm and normal heart sounds.   Pulmonary/Chest: Effort normal and breath sounds normal.  Skin: Skin is warm and dry.  Psychiatric: She has a normal mood and affect.          Assessment & Plan:

## 2010-10-02 NOTE — Telephone Encounter (Signed)
Call pt: Hemoglobin is back to normal. Iron level looks much better. Make sure eating iron rich foods. Also vita B12 is borderline. Consider starting Vit B12 daily, OTC. Shouldn't upset your stomac.

## 2010-10-02 NOTE — Assessment & Plan Note (Signed)
Warned her to be very careful about using Goodies powders as these can cause GI ulcer. Asked her to use them in moderattion and also to monitor carefully for any GI s.e.

## 2010-10-02 NOTE — Assessment & Plan Note (Addendum)
Discussed dx. She would like to not take anything this month and see how she does. Then we can consider starting prozac if needed. F/u in on1 month. Feel free to call back sooner if feels mood is declining.

## 2010-10-02 NOTE — Assessment & Plan Note (Signed)
Will recheck levels today to see if she needs to restart her iron.

## 2010-10-03 NOTE — Telephone Encounter (Signed)
Pt.notified

## 2010-10-07 NOTE — Discharge Summary (Signed)
  Terri Farrell, Terri Farrell               ACCOUNT NO.:  [id number]  MEDICAL RECORD NO.:  [id number]           PATIENT TYPE:  I  LOCATION:  5025                         FACILITY:  [facility]  PHYSICIAN:  Nadara Mustard, MD     DATE OF BIRTH:  1950-08-03  DATE OF ADMISSION:  08/20/2010 DATE OF DISCHARGE:  08/24/2010                              DISCHARGE SUMMARY   FINAL DIAGNOSIS:  Osteoarthritis.  SURGICAL PROCEDURES:  Total knee arthroplasty.  CONSULTS:  Cardiology consult obtained.  MEDICATIONS AT DISCHARGE:  Vicodin, Tylox, and Coumadin.  Remaining of her medications as per medical reconciliation form.  Discharged to home in stable condition.  Follow up in the office in 1 week.     Nadara Mustard, MD     MVD/MEDQ  D:  09/21/2010  T:  09/21/2010  Job:  [id number]  Electronically Signed by Aldean Baker MD on 10/07/2010 02:54:57 PM

## 2011-02-20 ENCOUNTER — Ambulatory Visit (INDEPENDENT_AMBULATORY_CARE_PROVIDER_SITE_OTHER): Payer: Medicare Other | Admitting: Family Medicine

## 2011-02-20 ENCOUNTER — Encounter: Payer: Self-pay | Admitting: Family Medicine

## 2011-02-20 DIAGNOSIS — M79609 Pain in unspecified limb: Secondary | ICD-10-CM

## 2011-02-20 DIAGNOSIS — E538 Deficiency of other specified B group vitamins: Secondary | ICD-10-CM

## 2011-02-20 DIAGNOSIS — M79672 Pain in left foot: Secondary | ICD-10-CM

## 2011-02-20 DIAGNOSIS — J302 Other seasonal allergic rhinitis: Secondary | ICD-10-CM

## 2011-02-20 DIAGNOSIS — R0683 Snoring: Secondary | ICD-10-CM

## 2011-02-20 DIAGNOSIS — J309 Allergic rhinitis, unspecified: Secondary | ICD-10-CM

## 2011-02-20 DIAGNOSIS — R0609 Other forms of dyspnea: Secondary | ICD-10-CM

## 2011-02-20 DIAGNOSIS — E611 Iron deficiency: Secondary | ICD-10-CM

## 2011-02-20 MED ORDER — FLUTICASONE FUROATE 27.5 MCG/SPRAY NA SUSP: 2.0000 | Freq: Every day | NASAL | Status: DC

## 2011-02-20 NOTE — Patient Instructions (Addendum)
Try OTC claritin once a day. Store brand is fine Start the prescription nasal spray for allergies as well We will call you with the referral for your sleep study and with podiatry.

## 2011-02-20 NOTE — Progress Notes (Signed)
  Subjective:    Patient ID: Terri Farrell, female    DOB: [birth year], 60 y.o.   MRN: [id number]  HPI Husband says she snores all the time, for years.  Not every night.  Eyes are watering all the time over the last couple of weeks. She also complains of sneezing, and congestion in her throat. No cough. No ear pian or pressure. No URI or fever.    Sister and brother with sleep apnea. Ooc wakes up with HA and feels fatigue all day. He is more irritable.  She has a history of mood disorder.  Also c/o left foot toe umbness that happens when she stands on her feet for awhile. Also wakes up with daily heal pain tht seems worse in the AM.  Review of Systems     Objective:   Physical Exam  Constitutional: She is oriented to person, place, and time. She appears well-developed and well-nourished.  HENT:  Head: Normocephalic and atraumatic.  Right Ear: External ear normal.  Left Ear: External ear normal.  Nose: Nose normal.  Mouth/Throat: Oropharynx is clear and moist.       TMs and canals are clear.   Eyes: Conjunctivae and EOM are normal. Pupils are equal, round, and reactive to light.  Neck: Neck supple. No thyromegaly present.  Cardiovascular: Normal rate, regular rhythm and normal heart sounds.   Pulmonary/Chest: Effort normal and breath sounds normal. She has no wheezes.  Lymphadenopathy:    She has no cervical adenopathy.  Neurological: She is alert and oriented to person, place, and time.  Skin: Skin is warm and dry.  Psychiatric: She has a normal mood and affect.          Assessment & Plan:  Allergies-  Will stat with OTC antihistamine and nasal steroid spray. She is not noticing significant improvement in the next 2 weeks please call office and let me know.  Snoring - Will refer for sleep study to eval for OSA. She has morning headaches, daytime sleepiness, snoring, obesity, and mood disorder. We will schedule her for split sleep study. Losing weight may also help her  sleep.  Left toe numbness-I will refer her to podiatry for further evaluation. As possible she could have a neuroma.  B12 and iron deficiency-her last labs in 4 months ago were finally normal. I would like to recheck this today to make sure that she is staying stable.

## 2011-02-21 LAB — VITAMIN B12: Vitamin B-12: 610 pg/mL (ref 211–911)

## 2011-02-21 LAB — IRON: Iron: 72 ug/dL (ref 42–145)

## 2011-02-23 ENCOUNTER — Telehealth: Payer: Self-pay | Admitting: Family Medicine

## 2011-02-23 NOTE — Telephone Encounter (Signed)
Call patient: Vitamin B12 and iron look great.

## 2011-02-23 NOTE — Telephone Encounter (Signed)
Pt aware.

## 2011-03-09 ENCOUNTER — Encounter: Payer: Self-pay | Admitting: Family Medicine

## 2011-03-09 DIAGNOSIS — E669 Obesity, unspecified: Secondary | ICD-10-CM | POA: Insufficient documentation

## 2011-03-09 DIAGNOSIS — G4700 Insomnia, unspecified: Secondary | ICD-10-CM | POA: Insufficient documentation

## 2011-03-25 ENCOUNTER — Encounter: Payer: Self-pay | Admitting: Family Medicine

## 2011-05-13 ENCOUNTER — Encounter: Payer: Self-pay | Admitting: Family Medicine

## 2011-05-13 ENCOUNTER — Ambulatory Visit (INDEPENDENT_AMBULATORY_CARE_PROVIDER_SITE_OTHER): Payer: Medicare Other | Admitting: Family Medicine

## 2011-05-13 VITALS — BP 155/82 | HR 90 | Wt 302.0 lb

## 2011-05-13 DIAGNOSIS — K219 Gastro-esophageal reflux disease without esophagitis: Secondary | ICD-10-CM

## 2011-05-13 DIAGNOSIS — R030 Elevated blood-pressure reading, without diagnosis of hypertension: Secondary | ICD-10-CM

## 2011-05-13 DIAGNOSIS — G5601 Carpal tunnel syndrome, right upper limb: Secondary | ICD-10-CM

## 2011-05-13 DIAGNOSIS — I10 Essential (primary) hypertension: Secondary | ICD-10-CM | POA: Insufficient documentation

## 2011-05-13 DIAGNOSIS — Z1231 Encounter for screening mammogram for malignant neoplasm of breast: Secondary | ICD-10-CM

## 2011-05-13 DIAGNOSIS — G5600 Carpal tunnel syndrome, unspecified upper limb: Secondary | ICD-10-CM

## 2011-05-13 MED ORDER — LISINOPRIL 20 MG PO TABS: 20.0000 mg | ORAL_TABLET | Freq: Every day | ORAL | Status: DC

## 2011-05-13 NOTE — Progress Notes (Signed)
Subjective:    Patient ID: Terri Farrell, female    DOB: Oct 19, 1950, 60 y.o.   MRN: [id number]  HPI Bilat carpel tunnel surery around 2006. Has had recurrence of some of her symptoms, has been having numbnessi n her right hand and pain radiating up to her elbow. Also having tingling in her 3,4, 5th digit. Takes aleve and goodies.  No weakness. Worse at night.  Her symptoms are somewhat better during the daytime.  GERD - Flares once a month. Worse after eating frieds.  Uses mylanta and TUMs and works well.   Elevated blood pressure-she denies any chest pain or shortness of breath. She says that she has noted that her blood pressures been slightly elevated when she sees her podiatrist once a month. She has also gained a couple pounds since she was last here. She has not been very active because of the pain in her feet. She has tried to cut out fried fatty foods.   Review of Systems BP 155/82  Pulse 90  Wt 302 lb (136.986 kg)    No Known Allergies  Past Medical History  Diagnosis Date  . Depression     Past Surgical History  Procedure Date  . Cesarean section     X 2  . Carpal tunnel release     right and left  . Abdominal hysterectomy 1996    for fibroids  . Total knee arthroplasty 08/2010    History   Social History  . Marital Status: Widowed    Spouse Name: N/A    Number of Children: N/A  . Years of Education: N/A   Occupational History  . Not on file.   Social History Main Topics  . Smoking status: Former Smoker -- 1.0 packs/day for 2 years    Types: Cigarettes  . Smokeless tobacco: Not on file  . Alcohol Use: No  . Drug Use: No  . Sexually Active:    Other Topics Concern  . Not on file   Social History Narrative  . No narrative on file    Family History  Problem Relation Age of Onset  . Diabetes Other     family hx of  . Breast cancer Paternal Aunt       Objective:   Physical Exam  Constitutional: She appears well-developed and well-nourished.    HENT:  Head: Normocephalic and atraumatic.  Cardiovascular: Normal rate, regular rhythm and normal heart sounds.   Pulmonary/Chest: Effort normal and breath sounds normal.  Musculoskeletal:       She has some tingling in her first finger with Tinel's sign. She has no symptoms with Phalen's. Wrists with normal range of motion no tenderness or swelling. Fingers with normal strength and range of motion. She is nontender over the lateral or medial epicondyle of her right elbow. Normal range of motion of her right shoulder and elbow. Strength is 5 out of 5.          Assessment & Plan:  Carpal tunnel-I suspect that she could have some recurrence of her symptoms, that she could have some nerve impingement at the elbow or shoulder as well. I would like to get her brace to wear at night for the next 2-3 weeks. If she is not noticing any improvement in her symptoms then please let me know. She may need further evaluation by EMG testing.  GERD - Stay awaying from fried foods. Avoid eating right before bedtime.  Zantac for about 6 weeks and see if get things  back under control.  Reviewed reflux hygiene.  HTN - BPs have been elevated at podiatrist office as well. Will start lisinopril. Will need ot get up to date labs. Warned about potential side effects of blood pressure medication. Followup in 6 weeks.

## 2011-05-21 LAB — COMPLETE METABOLIC PANEL WITH GFR
ALT: 13 U/L (ref 0–35)
AST: 20 U/L (ref 0–37)
Albumin: 4.1 g/dL (ref 3.5–5.2)
BUN: 10 mg/dL (ref 6–23)
Calcium: 8.8 mg/dL (ref 8.4–10.5)
Chloride: 107 mEq/L (ref 96–112)
Potassium: 4.5 mEq/L (ref 3.5–5.3)

## 2011-05-21 LAB — LIPID PANEL
LDL Cholesterol: 104 mg/dL — ABNORMAL HIGH (ref 0–99)
VLDL: 15 mg/dL (ref 0–40)

## 2011-06-03 ENCOUNTER — Encounter: Payer: Self-pay | Admitting: Family Medicine

## 2011-06-12 ENCOUNTER — Encounter: Payer: Self-pay | Admitting: Family Medicine

## 2011-06-12 ENCOUNTER — Ambulatory Visit (INDEPENDENT_AMBULATORY_CARE_PROVIDER_SITE_OTHER): Payer: Medicare Other | Admitting: Family Medicine

## 2011-06-12 VITALS — BP 154/77 | HR 77 | Wt 293.0 lb

## 2011-06-12 DIAGNOSIS — Z1211 Encounter for screening for malignant neoplasm of colon: Secondary | ICD-10-CM

## 2011-06-12 DIAGNOSIS — Z23 Encounter for immunization: Secondary | ICD-10-CM

## 2011-06-12 DIAGNOSIS — I10 Essential (primary) hypertension: Secondary | ICD-10-CM

## 2011-06-12 MED ORDER — LOSARTAN POTASSIUM 50 MG PO TABS: 50.0000 mg | ORAL_TABLET | Freq: Every day | ORAL | Status: DC

## 2011-06-12 NOTE — Patient Instructions (Signed)
Will change to losartan and f/u in 3 weeks. Start with half tab once a day.

## 2011-06-12 NOTE — Progress Notes (Signed)
  Subjective:    Patient ID: Terri Farrell, female    DOB: Jul 31, 1950, 60 y.o.   MRN: [id number]  HPI Says feels really tired on the lisinopril. Says also noticed increased frequency.  Also feels nauseated form time to time.  No CO or SOB. Has tried to increase her water intake. Lightheaded too. Says felt some better after was on med for about 2 weeks but then started feeling bad again. Didn't take her pilll this AM.  Did take it yesterday.    Review of Systems     Objective:   Physical Exam  Constitutional: She is oriented to person, place, and time. She appears well-developed and well-nourished.  HENT:  Head: Normocephalic and atraumatic.  Cardiovascular: Normal rate, regular rhythm and normal heart sounds.   Pulmonary/Chest: Effort normal and breath sounds normal.  Neurological: She is alert and oriented to person, place, and time.  Skin: Skin is warm and dry.  Psychiatric: She has a normal mood and affect. Her behavior is normal.          Assessment & Plan:  HTN - Will change to losartan and f/u in 3 weeks. Start with half tab once a day. Then increase to whole tab after one week. Call if any concerns.    Discussed need for colonoscopy.

## 2011-06-23 ENCOUNTER — Ambulatory Visit
Admission: RE | Admit: 2011-06-23 | Discharge: 2011-06-23 | Disposition: A | Discharge status: Home / Self Care | Payer: Medicare Other | Source: Ambulatory Visit | Attending: Family Medicine | Admitting: Family Medicine

## 2011-06-23 DIAGNOSIS — Z1231 Encounter for screening mammogram for malignant neoplasm of breast: Secondary | ICD-10-CM

## 2011-07-03 ENCOUNTER — Ambulatory Visit: Payer: Medicare Other | Admitting: Family Medicine

## 2011-07-07 ENCOUNTER — Ambulatory Visit (INDEPENDENT_AMBULATORY_CARE_PROVIDER_SITE_OTHER): Payer: Medicare Other | Admitting: Family Medicine

## 2011-07-07 ENCOUNTER — Encounter: Payer: Self-pay | Admitting: Family Medicine

## 2011-07-07 VITALS — BP 160/77 | HR 72 | Wt 294.0 lb

## 2011-07-07 DIAGNOSIS — I10 Essential (primary) hypertension: Secondary | ICD-10-CM

## 2011-07-07 MED ORDER — LOSARTAN POTASSIUM 100 MG PO TABS: 100.0000 mg | ORAL_TABLET | Freq: Every day | ORAL | Status: DC

## 2011-07-07 NOTE — Progress Notes (Signed)
  Subjective:    Patient ID: Terri Farrell, female    DOB: [birth year], 61 y.o.   MRN: [id number]  Hypertension This is a chronic problem. The current episode started more than 1 year ago. The problem has been gradually improving since onset. The problem is uncontrolled. Pertinent negatives include no blurred vision, chest pain, headaches or shortness of breath. There are no associated agents to hypertension. Risk factors for coronary artery disease include no known risk factors. The current treatment provides mild improvement. There are no compliance problems.   tolerating her losartan well.     Review of Systems  Eyes: Negative for blurred vision.  Respiratory: Negative for shortness of breath.   Cardiovascular: Negative for chest pain.  Neurological: Negative for headaches.       Objective:   Physical Exam  Constitutional: She is oriented to person, place, and time. She appears well-developed and well-nourished.  HENT:  Head: Normocephalic and atraumatic.  Cardiovascular: Normal rate, regular rhythm and normal heart sounds.   Pulmonary/Chest: Effort normal and breath sounds normal.  Neurological: She is alert and oriented to person, place, and time.  Skin: Skin is warm and dry.  Psychiatric: She has a normal mood and affect. Her behavior is normal.          Assessment & Plan:  HTN- Elevated today but tolerating losartan well. I will increase her dose 200 mg daily. See her back in 3-4 weeks before she sees for her refill. We will recheck a BMP today to make sure her kidney function and potassium are stable.   She is scheduled for colonoscopy for February sufficient stump, see her back.

## 2011-07-07 NOTE — Patient Instructions (Signed)

## 2011-07-08 LAB — BASIC METABOLIC PANEL
CO2: 25 mEq/L (ref 19–32)
Chloride: 104 mEq/L (ref 96–112)
Creat: 0.59 mg/dL (ref 0.50–1.10)
Glucose, Bld: 75 mg/dL (ref 70–99)
Sodium: 140 mEq/L (ref 135–145)

## 2011-07-28 ENCOUNTER — Ambulatory Visit (INDEPENDENT_AMBULATORY_CARE_PROVIDER_SITE_OTHER): Payer: Medicare Other | Admitting: Family Medicine

## 2011-07-28 ENCOUNTER — Encounter: Payer: Self-pay | Admitting: Family Medicine

## 2011-07-28 VITALS — BP 159/79 | HR 95 | Wt 291.0 lb

## 2011-07-28 DIAGNOSIS — R200 Anesthesia of skin: Secondary | ICD-10-CM

## 2011-07-28 DIAGNOSIS — J309 Allergic rhinitis, unspecified: Secondary | ICD-10-CM

## 2011-07-28 DIAGNOSIS — I10 Essential (primary) hypertension: Secondary | ICD-10-CM

## 2011-07-28 DIAGNOSIS — R209 Unspecified disturbances of skin sensation: Secondary | ICD-10-CM

## 2011-07-28 MED ORDER — METOPROLOL SUCCINATE ER 25 MG PO TB24: 25.0000 mg | ORAL_TABLET | Freq: Every day | ORAL | Status: DC

## 2011-07-28 MED ORDER — LOSARTAN POTASSIUM-HCTZ 50-12.5 MG PO TABS: 1.0000 | ORAL_TABLET | Freq: Every day | ORAL | Status: DC

## 2011-07-28 NOTE — Progress Notes (Signed)
  Subjective:    Patient ID: Terri Farrell, female    DOB: July 12, 1950, 61 y.o.   MRN: [id number]  HPI HTN - Says feeling bad for the last week.  Having numbness and tingling in both hands. Happens mostly at nigtht. Has already had surgery for carpal tunnel, but says her symptoms are very similar to when she had carpal tunnel. She rhythm labeled the bottle for losartan that can cause numbness and tingling and she is very concerned that it is from the medication. No wrosenig or alelviating sxs.  + paini in her right elbow at times. She denies any chest pain or shortness of breath. She has been very consistent in taking her medication. She says she's made some major changes in her diet has really been cutting back on salt. She started reading the labels and realize she was a lot more salt than she thought.   Having sinus symptoms.  Ithcing in her ear. Only uses nasal spray prn.  She feel iti is from her BP pill.  No cough or fever. Occasional runny nose. She has not tried any over-the-counter allergy medicines. No major nasal congestion. Again she went read on the losartan bottle that the  medication can cause cold - like symptoms so she wonders if this could be the cause.   Review of Systems     Objective:   Physical Exam  Constitutional: She is oriented to person, place, and time. She appears well-developed and well-nourished.  HENT:  Head: Normocephalic and atraumatic.  Right Ear: External ear normal.  Left Ear: External ear normal.  Nose: Nose normal.  Mouth/Throat: Oropharynx is clear and moist.       TMs and canals are clear.   Eyes: Conjunctivae and EOM are normal. Pupils are equal, round, and reactive to light.  Neck: Neck supple. No thyromegaly present.  Cardiovascular: Normal rate, regular rhythm and normal heart sounds.   Pulmonary/Chest: Effort normal and breath sounds normal. She has no wheezes.  Lymphadenopathy:    She has no cervical adenopathy.  Neurological: She is alert and  oriented to person, place, and time.  Skin: Skin is warm and dry.  Psychiatric: She has a normal mood and affect.          Assessment & Plan:  HTN - Stil not well controlled. She really feels the higher dose on th elosaratan is causing her sxs though I am not convinced but will dec her dose to 50mg  again and add 12.5mg  hct and add metoprolol 25mg  XL once a day. F/.U in 2 weeks.   AR - I really thinks she is having allergies. Rec trial of Claritin once a day for the next week and see if this improves her symptoms. If not she can call the office or melena. I really don't think this is from her blood pressure pill.  Hand numbness-she does have a history of carpal tunnel syndrome but she has had surgery. She says this doesn't feel like her carpal tunnel but minimal side effect profile but it could be from the losartan. We're going to decrease her dose back down to 50 mg which she tolerated well and see if this improves in the next couple weeks. If it does not to consider further evaluation with possible EMG study and to see if she is having I nerve conduction issues and she is starting at the carpal tunnel release.

## 2011-07-28 NOTE — Patient Instructions (Signed)
Try the claritin once a day to see if helps your nasal and ear symptoms

## 2011-08-07 ENCOUNTER — Encounter: Payer: Self-pay | Admitting: *Deleted

## 2011-08-11 ENCOUNTER — Encounter: Payer: Self-pay | Admitting: Family Medicine

## 2011-08-11 ENCOUNTER — Ambulatory Visit (INDEPENDENT_AMBULATORY_CARE_PROVIDER_SITE_OTHER): Payer: Medicare Other | Admitting: Family Medicine

## 2011-08-11 DIAGNOSIS — I10 Essential (primary) hypertension: Secondary | ICD-10-CM

## 2011-08-11 MED ORDER — METOPROLOL SUCCINATE ER 50 MG PO TB24: 50.0000 mg | ORAL_TABLET | Freq: Every day | ORAL | Status: DC

## 2011-08-11 NOTE — Progress Notes (Signed)
  Subjective:    Patient ID: Terri Farrell, female    DOB: 04/13/1951, 61 y.o.   MRN: [id number]  HPI HTN - has lost 5 lbs.  BP is down 15 points today. Has been taking her fluid pill at night and it has been waking her up to pee.    Review of Systems     Objective:   Physical Exam  Constitutional: She is oriented to person, place, and time. She appears well-developed and well-nourished.  HENT:  Head: Normocephalic and atraumatic.  Cardiovascular: Normal rate, regular rhythm and normal heart sounds.   Pulmonary/Chest: Effort normal and breath sounds normal.  Neurological: She is alert and oriented to person, place, and time.  Skin: Skin is warm and dry.  Psychiatric: She has a normal mood and affect. Her behavior is normal.          Assessment & Plan:  HTN - Incrase metoprolol 50mg .  F/u in 7 weeks. Doing well. NO SE on meds.  Keep up the goodwork on weight loss.

## 2011-08-11 NOTE — Patient Instructions (Addendum)
Take the losartan HCT (fluid Pill ) in the morning and move the metoprolol to bedtime Keep up the goodwork on your weight loss!!!

## 2011-08-20 ENCOUNTER — Telehealth: Payer: Self-pay | Admitting: *Deleted

## 2011-08-20 MED ORDER — METOPROLOL SUCCINATE ER 25 MG PO TB24: 25.0000 mg | ORAL_TABLET | Freq: Every day | ORAL | Status: DC

## 2011-08-20 MED ORDER — LOSARTAN POTASSIUM-HCTZ 100-12.5 MG PO TABS: 1.0000 | ORAL_TABLET | Freq: Every day | ORAL | Status: DC

## 2011-08-20 NOTE — Telephone Encounter (Signed)
Pt calls and states you increased the diuretic from 25mg  to 50mg  once a day and she is not feeling good. Tired, H/A, not sleeping good. Pt has not checked her BP

## 2011-08-20 NOTE — Telephone Encounter (Signed)
Pt.notified

## 2011-08-20 NOTE — Telephone Encounter (Signed)
She was supposed to increase her metoprolol to 50.  Have her go back to 25 if feeling poorly. Cna send a new rx for 25mg  XL once a day.  I will increase her losartan hct to 100/12.5mg .

## 2011-09-29 ENCOUNTER — Ambulatory Visit (INDEPENDENT_AMBULATORY_CARE_PROVIDER_SITE_OTHER): Payer: Medicare Other | Admitting: Family Medicine

## 2011-09-29 ENCOUNTER — Encounter: Payer: Self-pay | Admitting: Family Medicine

## 2011-09-29 VITALS — BP 133/70 | HR 76 | Ht 65.0 in | Wt 289.0 lb

## 2011-09-29 DIAGNOSIS — I10 Essential (primary) hypertension: Secondary | ICD-10-CM

## 2011-09-29 DIAGNOSIS — E669 Obesity, unspecified: Secondary | ICD-10-CM

## 2011-09-29 LAB — BASIC METABOLIC PANEL
Calcium: 9 mg/dL (ref 8.4–10.5)
Chloride: 116 mEq/L — ABNORMAL HIGH (ref 96–112)
Creat: 0.68 mg/dL (ref 0.50–1.10)
Sodium: 138 mEq/L (ref 135–145)

## 2011-09-29 NOTE — Progress Notes (Signed)
  Subjective:    Patient ID: Terri Farrell, female    DOB: 29-Oct-1950, 61 y.o.   MRN: [id number]  HPI HTN- doing well. No CP or SOB.  She says she is eating low salt diet. Says occ will retain fluid and swell.  She is continuing to work on her weight.  Using a cane today.    Review of Systems     Objective:   Physical Exam  Constitutional: She is oriented to person, place, and time. She appears well-developed and well-nourished.       Morbidly obese.   HENT:  Head: Normocephalic and atraumatic.  Cardiovascular: Normal rate, regular rhythm and normal heart sounds.        No carotid bruits.   Pulmonary/Chest: Effort normal and breath sounds normal.  Musculoskeletal: She exhibits no edema.  Neurological: She is alert and oriented to person, place, and time.  Skin: Skin is warm and dry.  Psychiatric: She has a normal mood and affect. Her behavior is normal.          Assessment & Plan:  HTN- BP is well controlled. At goal.  Doing well. F/u in 6 months. Corrected med list andnew one printed.   Obesity - Keep working on weight loss and diet.  Continue to watch salt intake to avoid swelling.

## 2011-10-29 ENCOUNTER — Telehealth: Payer: Self-pay | Admitting: *Deleted

## 2011-10-29 DIAGNOSIS — R899 Unspecified abnormal finding in specimens from other organs, systems and tissues: Secondary | ICD-10-CM

## 2011-11-02 ENCOUNTER — Other Ambulatory Visit: Payer: Self-pay | Admitting: Family Medicine

## 2011-12-02 ENCOUNTER — Other Ambulatory Visit: Payer: Self-pay | Admitting: Family Medicine

## 2012-02-02 ENCOUNTER — Other Ambulatory Visit: Payer: Self-pay | Admitting: Family Medicine

## 2012-04-01 ENCOUNTER — Other Ambulatory Visit: Payer: Self-pay | Admitting: Family Medicine

## 2012-04-27 ENCOUNTER — Ambulatory Visit: Payer: Medicare Other | Admitting: Family Medicine

## 2012-05-11 ENCOUNTER — Ambulatory Visit (INDEPENDENT_AMBULATORY_CARE_PROVIDER_SITE_OTHER): Payer: Medicare Other | Admitting: Family Medicine

## 2012-05-11 ENCOUNTER — Encounter: Payer: Self-pay | Admitting: Family Medicine

## 2012-05-11 VITALS — BP 139/76 | HR 65 | Ht 65.0 in | Wt 289.0 lb

## 2012-05-11 DIAGNOSIS — I10 Essential (primary) hypertension: Secondary | ICD-10-CM

## 2012-05-11 DIAGNOSIS — E669 Obesity, unspecified: Secondary | ICD-10-CM

## 2012-05-11 MED ORDER — METOPROLOL SUCCINATE ER 25 MG PO TB24: 25.0000 mg | ORAL_TABLET | Freq: Every day | ORAL | Status: DC

## 2012-05-11 MED ORDER — AMITRIPTYLINE HCL 25 MG PO TABS: 25.0000 mg | ORAL_TABLET | Freq: Every day | ORAL | Status: DC

## 2012-05-11 MED ORDER — LOSARTAN POTASSIUM-HCTZ 100-12.5 MG PO TABS: 1.0000 | ORAL_TABLET | Freq: Every day | ORAL | Status: DC

## 2012-05-11 NOTE — Progress Notes (Signed)
  Subjective:    Patient ID: Terri Farrell, female    DOB: Nov 25, 1950, 61 y.o.   MRN: [id number]  HPI HTN - Out of meds for a week.  No CP or SOB.  Usually takes her meds regularly. Last appointment was 6 months ago.  Obesity-She has been very stresssed. Her ather passed away. She has been eating whatever she wanted.  She moved to help her mother. Not exercising.     Review of Systems     Objective:   Physical Exam  Constitutional: She is oriented to person, place, and time. She appears well-developed and well-nourished.  HENT:  Head: Normocephalic and atraumatic.  Cardiovascular: Normal rate, regular rhythm and normal heart sounds.   Pulmonary/Chest: Effort normal and breath sounds normal.  Neurological: She is alert and oriented to person, place, and time.  Skin: Skin is warm and dry.  Psychiatric: She has a normal mood and affect. Her behavior is normal.          Assessment & Plan:  HTN- Uncontrolled.  Restart medications. Followup in 6 months. Due for labwork. Lab slip given. Encouraged her to go next couple weeks or fasting.  Obesity - Uncontrolled. Discussed working on diet and exercise. Getting back on track with her weight loss. She was doing well for a while. I think this would make a big difference as far as her hip and knee pain. And it would also help with her depression.

## 2012-05-20 LAB — LIPID PANEL
LDL Cholesterol: 105 mg/dL — ABNORMAL HIGH (ref 0–99)
Total CHOL/HDL Ratio: 3 Ratio
VLDL: 12 mg/dL (ref 0–40)

## 2012-05-20 LAB — COMPLETE METABOLIC PANEL WITH GFR
AST: 15 U/L (ref 0–37)
Albumin: 4.1 g/dL (ref 3.5–5.2)
Alkaline Phosphatase: 58 U/L (ref 39–117)
Potassium: 4.8 mEq/L (ref 3.5–5.3)
Sodium: 140 mEq/L (ref 135–145)
Total Protein: 7.1 g/dL (ref 6.0–8.3)

## 2012-11-03 ENCOUNTER — Other Ambulatory Visit: Payer: Self-pay | Admitting: Family Medicine

## 2012-12-19 ENCOUNTER — Ambulatory Visit (INDEPENDENT_AMBULATORY_CARE_PROVIDER_SITE_OTHER): Payer: Medicare Other

## 2012-12-19 ENCOUNTER — Encounter: Payer: Self-pay | Admitting: Family Medicine

## 2012-12-19 ENCOUNTER — Other Ambulatory Visit: Payer: Self-pay | Admitting: Family Medicine

## 2012-12-19 ENCOUNTER — Telehealth: Payer: Self-pay | Admitting: Family Medicine

## 2012-12-19 ENCOUNTER — Ambulatory Visit (INDEPENDENT_AMBULATORY_CARE_PROVIDER_SITE_OTHER): Payer: Medicare Other | Admitting: Family Medicine

## 2012-12-19 VITALS — BP 129/74 | HR 65 | Wt 280.0 lb

## 2012-12-19 DIAGNOSIS — M25559 Pain in unspecified hip: Secondary | ICD-10-CM

## 2012-12-19 DIAGNOSIS — M169 Osteoarthritis of hip, unspecified: Secondary | ICD-10-CM

## 2012-12-19 DIAGNOSIS — Z6841 Body Mass Index (BMI) 40.0 and over, adult: Secondary | ICD-10-CM

## 2012-12-19 DIAGNOSIS — M25551 Pain in right hip: Secondary | ICD-10-CM

## 2012-12-19 DIAGNOSIS — N318 Other neuromuscular dysfunction of bladder: Secondary | ICD-10-CM

## 2012-12-19 DIAGNOSIS — I10 Essential (primary) hypertension: Secondary | ICD-10-CM

## 2012-12-19 DIAGNOSIS — R350 Frequency of micturition: Secondary | ICD-10-CM

## 2012-12-19 DIAGNOSIS — N3281 Overactive bladder: Secondary | ICD-10-CM

## 2012-12-19 DIAGNOSIS — R9389 Abnormal findings on diagnostic imaging of other specified body structures: Secondary | ICD-10-CM

## 2012-12-19 DIAGNOSIS — Z1231 Encounter for screening mammogram for malignant neoplasm of breast: Secondary | ICD-10-CM

## 2012-12-19 LAB — POCT URINALYSIS DIPSTICK
Blood, UA: NEGATIVE
Ketones, UA: NEGATIVE
Protein, UA: NEGATIVE
Spec Grav, UA: 1.025
Urobilinogen, UA: 0.2

## 2012-12-19 MED ORDER — AMITRIPTYLINE HCL 25 MG PO TABS: ORAL_TABLET | ORAL | Status: DC

## 2012-12-19 MED ORDER — LOSARTAN POTASSIUM-HCTZ 100-12.5 MG PO TABS: ORAL_TABLET | ORAL | Status: DC

## 2012-12-19 NOTE — Telephone Encounter (Signed)
Pt informed.Terri Farrell Lynetta  

## 2012-12-19 NOTE — Telephone Encounter (Signed)
Call patient: Her urine was normal. She could have overactive bladder causing her to urinate frequently. There are some prescription medications that can be very helpful for this. I would like to see her back in a couple weeks and we can discuss treatment options. We might even think about taking the diuretic that her blood pressure pill to improve her symptoms as well.

## 2012-12-19 NOTE — Progress Notes (Signed)
  Subjective:    Patient ID: Terri Farrell, female    DOB: December 04, 1950, 62 y.o.   MRN: [id number]  HPI HTN - Says has been exercising 2 days per week. She has lost 40 lbs since last fall.  Says her hips have keep her from walking like she wants too. Says right hip locks on and off x 6 months. Has mostly been using OTC pain reliever but they are starting to irritate her stomach.  Had xraysin 2008 when eval for disability.  She says occ pain wakes her up at night.  Pt denies chest pain, SOB, dizziness, or heart palpitations.  Taking meds as directed w/o problems.  Denies medication side effects.     Urinary frequency x 6 months and leaking of urine.  Goes to the bathroom every 2 hours at night.  No dysuria or hematuria. No fever.     Review of Systems     Objective:   Physical Exam  Constitutional: She is oriented to person, place, and time. She appears well-developed and well-nourished.  HENT:  Head: Normocephalic and atraumatic.  Neck: Neck supple. No thyromegaly present.  Cardiovascular: Normal rate, regular rhythm and normal heart sounds.   Pulmonary/Chest: Effort normal and breath sounds normal.  Musculoskeletal:  Tender over the right greater trochanteric.  No sig pain with int/ext rotation, but very dec ROM.  Hip, knee and ankle strength 5/5.   Lymphadenopathy:    She has no cervical adenopathy.  Neurological: She is alert and oriented to person, place, and time.  Skin: Skin is warm and dry.  Psychiatric: She has a normal mood and affect. Her behavior is normal.          Assessment & Plan:  HTN - Well controlled. F/U in 6 months. Will recheck labs at that time.   Urinary frequency - will check UA today.  Urinalysis was negative. Symptoms most consistent with overactive bladder. Followup in a couple weeks to discuss treatment options. We could also consider stopping her diuretic as this will certainly aggravate her urinary frequency.  Obesity - she has done fantastic. Lost 40  lbs! Keep up the good work. Continue to exercise regularly. Weight loss will also help with her osteoarthritis.  Right Hip Pain OA - refer for possible injection.  Probably had think she probably has osteoporosis of the hip as well as some trochanteric bursitis. She can schedule appointment partner Dr. Rodney Langton for this. We will go ahead and get x-rays today since her last visit was about 6 years ago and she was not having catching and locking at that time.

## 2012-12-20 ENCOUNTER — Ambulatory Visit: Payer: Medicare Other | Admitting: Sports Medicine

## 2012-12-22 ENCOUNTER — Ambulatory Visit (INDEPENDENT_AMBULATORY_CARE_PROVIDER_SITE_OTHER): Payer: Medicare Other

## 2012-12-22 DIAGNOSIS — Z1231 Encounter for screening mammogram for malignant neoplasm of breast: Secondary | ICD-10-CM

## 2012-12-30 ENCOUNTER — Telehealth: Payer: Self-pay | Admitting: *Deleted

## 2012-12-30 NOTE — Telephone Encounter (Signed)
OK, have Tammy call and get her in here at [facility] in [place].

## 2012-12-30 NOTE — Telephone Encounter (Signed)
Pt calls & states that they had to cancel her appt with dr Charlann Boxer today & it's not rescheduled until 8-22.  She wants to know if you can give her another referral to someone else here in [place] that could get her in sooner.  Please advise

## 2013-01-02 NOTE — Telephone Encounter (Signed)
Gave Terri Farrell patient's info

## 2013-01-27 ENCOUNTER — Encounter: Payer: Self-pay | Admitting: Family Medicine

## 2013-04-14 ENCOUNTER — Ambulatory Visit (INDEPENDENT_AMBULATORY_CARE_PROVIDER_SITE_OTHER): Payer: Medicare Other | Admitting: Family Medicine

## 2013-04-14 ENCOUNTER — Encounter: Payer: Self-pay | Admitting: Family Medicine

## 2013-04-14 VITALS — BP 130/65 | HR 61 | Ht 64.0 in | Wt 264.0 lb

## 2013-04-14 DIAGNOSIS — K5900 Constipation, unspecified: Secondary | ICD-10-CM

## 2013-04-14 DIAGNOSIS — R32 Unspecified urinary incontinence: Secondary | ICD-10-CM

## 2013-04-14 DIAGNOSIS — R252 Cramp and spasm: Secondary | ICD-10-CM

## 2013-04-14 DIAGNOSIS — I10 Essential (primary) hypertension: Secondary | ICD-10-CM

## 2013-04-14 MED ORDER — FLUTICASONE FUROATE 27.5 MCG/SPRAY NA SUSP: 2.0000 | Freq: Every day | NASAL | Status: DC

## 2013-04-14 MED ORDER — AMITRIPTYLINE HCL 25 MG PO TABS: ORAL_TABLET | ORAL | Status: DC

## 2013-04-14 NOTE — Progress Notes (Signed)
  Subjective:    Patient ID: Terri Farrell, female    DOB: 1951/04/17, 62 y.o.   MRN: [id number]  HPI HTN -  Pt denies chest pain, SOB, dizziness, or heart palpitations.  Taking meds as directed w/o problems.  Denies medication side effects.  Has lost 16 more lbs since I last saw her. No swelling.    Orhto had recommended hip replacement but has to have her teeth pulled first. She is continuing to work on weight loss before she has her hip replacement done. She's doing fantastic with dietary change.  Has been having more cramping in arms and hands and will get cramps in her stomach. Having a lot of gas as well. BM have been less frequent.  Has recently dec the fiber in her diet.   We discussed some urinary incontinence last time. She was not interested in taking a medication. She's not interested.   Review of Systems     Objective:   Physical Exam  Constitutional: She is oriented to person, place, and time. She appears well-developed and well-nourished.  HENT:  Head: Normocephalic and atraumatic.  Cardiovascular: Normal rate, regular rhythm and normal heart sounds.   Pulmonary/Chest: Effort normal and breath sounds normal.  Neurological: She is alert and oriented to person, place, and time.  Skin: Skin is warm and dry.  Psychiatric: She has a normal mood and affect. Her behavior is normal.          Assessment & Plan:  HTN - Well controlled.  Continue current regimen. Followup in 6 months.  Constipation. -she plans to get back on her fiber.  Could add a stool softerner if needed. She was not interested in taking any medications for it. Make sure drinking plenty of water..    Obesity - she is doing absolutely fantastic with her weight loss. She's lost them as 50 pounds. She's doing this primarily with dietary changes as she is not able to exercise because she needs a hip replacement. Encouraged her to keep up the good work. Blood pressure is well-controlled. Like to recheck her  lipids today to make sure that they look better as well. Last lipids were a year ago.  Shingles vaccine needed. She did check into it at the pharmacy but has to complete paperwork to see what the coverage would be on it.  Urinary incontinence-given handout on Kegel exercises. She can surly consider medication if it becomes more bothersome.  Cramping-will check potassium levels today since she is on medication affect potassium. We'll check her electrolytes kidney and liver function as well.

## 2013-04-14 NOTE — Patient Instructions (Signed)

## 2013-04-15 LAB — LIPID PANEL
HDL: 71 mg/dL (ref >39)
LDL Cholesterol: 110 mg/dL — ABNORMAL HIGH (ref 0–99)
Triglycerides: 83 mg/dL (ref <150)

## 2013-04-15 LAB — COMPLETE METABOLIC PANEL WITH GFR
ALT: 15 U/L (ref 0–35)
Alkaline Phosphatase: 63 U/L (ref 39–117)
CO2: 22 mEq/L (ref 19–32)
Creat: 0.83 mg/dL (ref 0.50–1.10)
GFR, Est African American: 89 mL/min
Total Bilirubin: 0.4 mg/dL (ref 0.3–1.2)

## 2013-05-09 ENCOUNTER — Ambulatory Visit (INDEPENDENT_AMBULATORY_CARE_PROVIDER_SITE_OTHER): Payer: Medicare Other | Admitting: Family Medicine

## 2013-05-09 ENCOUNTER — Encounter: Payer: Self-pay | Admitting: Family Medicine

## 2013-05-09 VITALS — BP 116/64 | HR 63 | Temp 97.3°F | Ht 64.0 in | Wt 265.0 lb

## 2013-05-09 DIAGNOSIS — K219 Gastro-esophageal reflux disease without esophagitis: Secondary | ICD-10-CM

## 2013-05-09 DIAGNOSIS — K5900 Constipation, unspecified: Secondary | ICD-10-CM

## 2013-05-09 DIAGNOSIS — I10 Essential (primary) hypertension: Secondary | ICD-10-CM

## 2013-05-09 DIAGNOSIS — R0789 Other chest pain: Secondary | ICD-10-CM

## 2013-05-09 MED ORDER — LUBIPROSTONE 24 MCG PO CAPS: 24.0000 ug | ORAL_CAPSULE | Freq: Two times a day (BID) | ORAL | Status: DC

## 2013-05-09 MED ORDER — LOSARTAN POTASSIUM 100 MG PO TABS: 100.0000 mg | ORAL_TABLET | Freq: Every day | ORAL | Status: DC

## 2013-05-09 NOTE — Patient Instructions (Addendum)
Try the Amitiza 24 mcg. One twice a day with food and water. This should help with the constipation. If it's helpful then please let me know and we can get her prescription. We will call you with the lab results once they are available. I will cut back on the women's as it is very ascitic. Sent over a new prescription for plain losartan without the hydrochlorothiazide component. Still take 1 a day. Dexilant once a day about 30 min before breakfast.

## 2013-05-09 NOTE — Progress Notes (Signed)
Subjective:    Patient ID: Terri Farrell, female    DOB: 1950-12-01, 62 y.o.   MRN: [id number]  HPI  HTN -  Pt denies chest pain, SOB, dizziness, or heart palpitations.  Taking meds as directed w/o problems.  Denies medication side effects.    Having indigeston and lightheaded for 2-3 weeks.  She has been getting some abdominal pain that radiates to her back sand then has a lot of gas.  No fever or chills.  Intermittant CP on and off for 1 week.  Usually last 15 min.  TUMS and lemon make it helps. No injury to the chest wall.  No nausea or sweats with it. No radiation into arms or jar.  Feels like a pressure and burning sensation.   Still having problems with constipation.  She has tried stool softeners, she has tried Clinical biochemist. She's also tried stimulants. They don't seem to help. She's going a little less frequently so that also has not been eating as much. She says she really tries hard to hydrate well. Her stools have still been hard.  Review of Systems BP 116/64  Pulse 63  Temp(Src) 97.3 F (36.3 C)  Ht 5\' 4"  (1.626 m)  Wt 265 lb (120.203 kg)  BMI 45.46 kg/m2    Allergies  Allergen Reactions  . Lisinopril Other (See Comments)    Fatigue     Past Medical History  Diagnosis Date  . Depression   . Arthritis     RA    Past Surgical History  Procedure Laterality Date  . Cesarean section      X 2  . Carpal tunnel release      right and left  . Abdominal hysterectomy  1996    for fibroids  . Total knee arthroplasty  08/2010    History   Social History  . Marital Status: Widowed    Spouse Name: N/A    Number of Children: N/A  . Years of Education: N/A   Occupational History  . Not on file.   Social History Main Topics  . Smoking status: Former Smoker -- 1.00 packs/day for 2 years    Types: Cigarettes  . Smokeless tobacco: Not on file  . Alcohol Use: No  . Drug Use: No  . Sexual Activity:    Other Topics Concern  . Not on file   Social History Narrative   . No narrative on file    Family History  Problem Relation Age of Onset  . Diabetes Other     family hx of  . Breast cancer Paternal Aunt     Outpatient Encounter Prescriptions as of 05/09/2013  Medication Sig  . fluticasone (VERAMYST) 27.5 MCG/SPRAY nasal spray Place 2 sprays into the nose daily.  . [DISCONTINUED] losartan-hydrochlorothiazide (HYZAAR) 100-12.5 MG per tablet TAKE 1 TABLET BY MOUTH EVERY DAY  . losartan (COZAAR) 100 MG tablet Take 1 tablet (100 mg total) by mouth daily.  Marland Kitchen lubiprostone (AMITIZA) 24 MCG capsule Take 1 capsule (24 mcg total) by mouth 2 (two) times daily with a meal.          Objective:   Physical Exam  Constitutional: She is oriented to person, place, and time. She appears well-developed and well-nourished.  HENT:  Head: Normocephalic and atraumatic.  Cardiovascular: Normal rate, regular rhythm and normal heart sounds.   Pulmonary/Chest: Effort normal and breath sounds normal.  Abdominal: Soft. Bowel sounds are normal. She exhibits no distension and no mass. There is no tenderness. There  is no rebound and no guarding.  Neurological: She is alert and oriented to person, place, and time.  Skin: Skin is warm and dry.  Psychiatric: She has a normal mood and affect. Her behavior is normal.          Assessment & Plan:  HTN - well controlled. In fact her blood pressure is a little on the lower end and she's been having some dizzy spells. With that perhaps chlorothiazide and continue the losartan. Followup in 6-8 weeks to make sure blood pressure still is good. She continues to have dizzy spells and please let me know. Also stopping her chlorothiazide might help with her constipation as well.  Atypical CP - EKg shows rate of 70 bpm,NSR, with LAD (nonpathologic) no acute changes.  The LAD and inverted T waves in Lead V2 and V3 is different from prior EKG in 2010.  Will check cardiac enzymes.  Check TSH as well. Will check CBC to by way for anemia as  well.  GERD - I. do think she is having some reflux symptoms. Did encourage her to quit drinking lemon juice as this can be exacerbating her symptoms. She does get response at times so give her a trial sample of dexilant. If it's helpful then we'll continue a PPI for 6-8 weeks and then wean as tolerated.  Constipation- Given sample of Amitiza to try over the weekend. If helpful then call and we will send her for a prescription.

## 2013-05-10 LAB — CBC WITH DIFFERENTIAL/PLATELET
Basophils Relative: 1 % (ref 0–1)
Eosinophils Absolute: 0.2 10*3/uL (ref 0.0–0.7)
Eosinophils Relative: 2 % (ref 0–5)
HCT: 37.4 % (ref 36.0–46.0)
Hemoglobin: 12.2 g/dL (ref 12.0–15.0)
MCH: 28.8 pg (ref 26.0–34.0)
MCHC: 32.6 g/dL (ref 30.0–36.0)
Monocytes Absolute: 0.5 10*3/uL (ref 0.1–1.0)
Monocytes Relative: 6 % (ref 3–12)
Neutro Abs: 4.1 10*3/uL (ref 1.7–7.7)
Neutrophils Relative %: 54 % (ref 43–77)
RDW: 13.8 % (ref 11.5–15.5)

## 2013-05-10 LAB — CK TOTAL AND CKMB (NOT AT ARMC): CK, MB: 0.7 ng/mL (ref 0.0–5.0)

## 2013-07-04 ENCOUNTER — Encounter: Payer: Self-pay | Admitting: Family Medicine

## 2013-07-04 ENCOUNTER — Other Ambulatory Visit: Payer: Self-pay | Admitting: *Deleted

## 2013-07-04 ENCOUNTER — Ambulatory Visit (INDEPENDENT_AMBULATORY_CARE_PROVIDER_SITE_OTHER): Payer: Medicare HMO | Admitting: Family Medicine

## 2013-07-04 VITALS — BP 140/72 | HR 65 | Temp 98.4°F | Ht 64.0 in | Wt 264.0 lb

## 2013-07-04 DIAGNOSIS — I10 Essential (primary) hypertension: Secondary | ICD-10-CM

## 2013-07-04 DIAGNOSIS — Z6841 Body Mass Index (BMI) 40.0 and over, adult: Secondary | ICD-10-CM

## 2013-07-04 DIAGNOSIS — K5900 Constipation, unspecified: Secondary | ICD-10-CM

## 2013-07-04 MED ORDER — LUBIPROSTONE 24 MCG PO CAPS: 24.0000 ug | ORAL_CAPSULE | Freq: Two times a day (BID) | ORAL | Status: DC

## 2013-07-04 NOTE — Progress Notes (Signed)
   Subjective:    Patient ID: Terri SanteeDeborah Genther, female    DOB: [birth year], 63 y.o.   MRN: [id number]  HPI Hypertension- Pt denies chest pain, SOB, dizziness, or heart palpitations.  Taking meds as directed w/o problems.  Denies medication side effects.    Constipation - on Amitiza and worked well. She ran out of it and went back to using some over-the-counter meds. They do not work nearly as well. She now has new health insurance and would like us to send a new prescription.  She maintained her weight over the holidays. She said she really try very hard to avoid eating a lot of sweets and things like that.  Review of Systems     Objective:   Physical Exam  Constitutional: She is oriented to person, place, and time. She appears well-developed and well-nourished.  HENT:  Head: Normocephalic and atraumatic.  Cardiovascular: Normal rate, regular rhythm and normal heart sounds.   Pulmonary/Chest: Effort normal and breath sounds normal.  Neurological: She is alert and oriented to person, place, and time.  Skin: Skin is warm and dry.  Psychiatric: She has a normal mood and affect. Her behavior is normal.          Assessment & Plan:  HTN -  Pt denies chest pain, SOB, dizziness, or heart palpitations.  Taking meds as directed w/o problems.  Denies medication side effects.  5 min spent with pt.  Constipation - Will restart Amitiza. Will see if new insurance will cover.   Obesity - she has really done well oveall through the Holiday. conitinue to work on diet.

## 2013-08-17 ENCOUNTER — Other Ambulatory Visit: Payer: Self-pay | Admitting: *Deleted

## 2013-08-17 MED ORDER — LOSARTAN POTASSIUM 100 MG PO TABS: 100.0000 mg | ORAL_TABLET | Freq: Every day | ORAL | Status: DC

## 2013-11-16 ENCOUNTER — Ambulatory Visit (INDEPENDENT_AMBULATORY_CARE_PROVIDER_SITE_OTHER): Payer: Commercial Managed Care - HMO | Admitting: Family Medicine

## 2013-11-16 ENCOUNTER — Encounter: Payer: Self-pay | Admitting: Family Medicine

## 2013-11-16 VITALS — BP 104/59 | HR 56 | Ht 64.0 in | Wt 250.0 lb

## 2013-11-16 DIAGNOSIS — M25559 Pain in unspecified hip: Secondary | ICD-10-CM

## 2013-11-16 DIAGNOSIS — K5909 Other constipation: Secondary | ICD-10-CM

## 2013-11-16 DIAGNOSIS — K219 Gastro-esophageal reflux disease without esophagitis: Secondary | ICD-10-CM | POA: Diagnosis not present

## 2013-11-16 DIAGNOSIS — I10 Essential (primary) hypertension: Secondary | ICD-10-CM

## 2013-11-16 DIAGNOSIS — G8929 Other chronic pain: Secondary | ICD-10-CM

## 2013-11-16 DIAGNOSIS — K5900 Constipation, unspecified: Secondary | ICD-10-CM | POA: Diagnosis not present

## 2013-11-16 MED ORDER — LINACLOTIDE 145 MCG PO CAPS: 145.0000 ug | ORAL_CAPSULE | Freq: Every day | ORAL | Status: DC

## 2013-11-16 NOTE — Patient Instructions (Signed)
Diet for Gastroesophageal Reflux Disease, Adult  Reflux is when stomach acid flows up into the esophagus. The esophagus becomes irritated and sore (inflammation). When reflux happens often and is severe, it is called gastroesophageal reflux disease (GERD). What you eat can help ease any discomfort caused by GERD.  FOODS OR DRINKS TO AVOID OR LIMIT   Coffee and black tea, with or without caffeine.   Bubbly (carbonated) drinks with caffeine or energy drinks.   Strong spices, such as pepper, cayenne pepper, curry, or chili powder.   Peppermint or spearmint.   Chocolate.   High-fat foods, such as meats, fried food, oils, butter, or nuts.   Fruits and vegetables that cause discomfort. This includes citrus fruits and tomatoes.   Alcohol.  If a certain food or drink irritates your GERD, avoid eating or drinking it.  THINGS THAT MAY HELP GERD INCLUDE:   Eat meals slowly.   Eat 5 to 6 small meals a day, not 3 large meals.   Do not eat food for a certain amount of time if it causes discomfort.   Wait 3 hours after eating before lying down.   Keep the head of your bed raised 6 to 9 inches (15 23 centimeters). Put a foam wedge or blocks under the legs of the bed.   Stay active. Weight loss, if needed, may help ease your discomfort.   Wear loose-fitting clothing.   Do not smoke or chew tobacco.  Document Released: 12/01/2011 Document Reviewed: 12/01/2011  [employer] Patient Information 2014 [employer].

## 2013-11-16 NOTE — Progress Notes (Signed)
   Subjective:    Patient ID: Terri Farrell, female    DOB: 27-Apr-1951, 63 y.o.   MRN: [id number]  HPI Hypertension- Pt denies chest pain, SOB, dizziness, or heart palpitations.  Taking meds as directed w/o problems.  Denies medication side effects.    Followup chronic constipation-we restarted her Amitiza about 4 months ago. Says will not have a BM for 2 days and then wil have diarrhea. She is getting a lot of cramping in her bowels.  She taken it previously. Unfortunately, she now feels like it's not working very well and would like to discuss an alternative. occ having heartburn/reflux sxs. Used to take prevacid.    She is still having significant hip pain. She did see an orthopedist previously who recommended hip surgery/replacement but want her to have her teeth extracted first. She was not willing to do this. She's never had an injection or therapy for the hip. She says some days her pain is so severe that she has just get back in bed. She doesn't really take medication for it.  Review of Systems     Objective:   Physical Exam  Constitutional: She is oriented to person, place, and time. She appears well-developed and well-nourished.  HENT:  Head: Normocephalic and atraumatic.  Cardiovascular: Normal rate, regular rhythm and normal heart sounds.   Pulmonary/Chest: Effort normal and breath sounds normal.  Neurological: She is alert and oriented to person, place, and time.  Skin: Skin is warm and dry.  Psychiatric: She has a normal mood and affect. Her behavior is normal.          Assessment & Plan:  Hypertension- has lost 14 pounds which is fantastic! Blood pressures well controlled. Blood pressures actually little bit low today. We'll cut losartan and half. F/U in 2 months to recheck BP.    Chronic constipation- will try linzess, since the Amitiza has not been very effective..  She is drinking plenty of water.   GERD - discussed reflux diet.  Recommend restart her prevacid for  2-3 weeks to get sxs under control.    Morbid obesity/BMI > 40- she's continued to do a fantastic job with her weight loss. Every time she can come back and she's lost a significant amount of weight. She's not really able to exercise and has been doing this primarily with diet changes. Compare prior of her and encouraged her to keep going. Let us see her under 200 pounds. I think this would make a big difference with her back and her hip as well.  Hip pain/osteoarthritis - she uses a cane to ambulate and has for the last couple of years. I would like her to see my partner, Dr. Rodney Langton, sports medicine physician to discuss treatment options and possibly consider injection for pain relief. She may still ultimately need hip replacement that maybe he could make a recommendation for her.

## 2013-11-21 ENCOUNTER — Ambulatory Visit (INDEPENDENT_AMBULATORY_CARE_PROVIDER_SITE_OTHER): Payer: Medicare HMO | Admitting: Sports Medicine

## 2013-11-21 ENCOUNTER — Encounter: Payer: Self-pay | Admitting: Sports Medicine

## 2013-11-21 VITALS — BP 106/62 | HR 62 | Wt 252.0 lb

## 2013-11-21 DIAGNOSIS — M1610 Unilateral primary osteoarthritis, unspecified hip: Secondary | ICD-10-CM

## 2013-11-21 DIAGNOSIS — M1611 Unilateral primary osteoarthritis, right hip: Secondary | ICD-10-CM | POA: Insufficient documentation

## 2013-11-21 DIAGNOSIS — M169 Osteoarthritis of hip, unspecified: Secondary | ICD-10-CM

## 2013-11-21 NOTE — Progress Notes (Signed)
   Subjective:    I'm seeing this patient as a consultation for:  Dr. Linford Arnold  CC: Right hip pain  HPI: This is a very pleasant 63 year-old female with a long history of right hip osteoarthritis, she has never had injections, it was recommended in the past that she undergo total hip arthroplasty. Pain is moderate, persistent, and has failed conservative measures including oral analgesics.  Past medical history, Surgical history, Family history not pertinant except as noted below, Social history, Allergies, and medications have been entered into the medical record, reviewed, and no changes needed.   Review of Systems: No headache, visual changes, nausea, vomiting, diarrhea, constipation, dizziness, abdominal pain, skin rash, fevers, chills, night sweats, weight loss, swollen lymph nodes, body aches, joint swelling, muscle aches, chest pain, shortness of breath, mood changes, visual or auditory hallucinations.   Objective:   General: Well Developed, well nourished, and in no acute distress.  Neuro/Psych: Alert and oriented x3, extra-ocular muscles intact, able to move all 4 extremities, sensation grossly intact. Skin: Warm and dry, no rashes noted.  Respiratory: Not using accessory muscles, speaking in full sentences, trachea midline.  Cardiovascular: Pulses palpable, no extremity edema. Abdomen: Does not appear distended. Right hip: There is very little motion, it feels almost fused.  X-rays reviewed show severe collapse of the femoral head with severe osteoarthritis.  Procedure: Real-time Ultrasound Guided Injection of right femoral acetabular joint Device: GE Logiq E  Verbal informed consent obtained.  Time-out conducted.  Noted no overlying erythema, induration, or other signs of local infection.  Skin prepped in a sterile fashion.  Local anesthesia: Topical Ethyl chloride.  With sterile technique and under real time ultrasound guidance:  Spinal needle advanced to the femoral  head/neck junction, 2 cc Kenalog 40, 4 cc lidocaine injected. Completed without difficulty  Pain immediately resolved suggesting accurate placement of the medication.  Advised to call if fevers/chills, erythema, induration, drainage, or persistent bleeding.  Images permanently stored and available for review in the ultrasound unit.  Impression: Technically successful ultrasound guided injection.  Impression and Recommendations:   This case required medical decision making of moderate complexity.

## 2013-11-21 NOTE — Assessment & Plan Note (Addendum)
Terri Farrell has severe osteoarthritis with collapse of the femoral head, she is bone on bone with likely partial fusion. I injected her today for temporary relief but she will need a total hip arthroplasty. Referral to Dr. Gean Birchwood with [facility]

## 2013-12-04 ENCOUNTER — Telehealth: Payer: Self-pay

## 2013-12-04 NOTE — Telephone Encounter (Signed)
Terri PoundDeborah complains of diarrhea 6 times and dark stool for 1 day. She also took a stool softener yesterday along with her Linzess. I asked her the name of stool softener and she believes it is Amitiza. I advised her that Amitiza is the same type of medication as Linzess. I advised her she will need to discard all of the Amitiza. She has had some stomach cramping. Denies fever, chills or sweats.

## 2013-12-04 NOTE — Telephone Encounter (Signed)
i agree. Stop the amitiza. We had switched to the linzess bc she felt the [place] didn't work. If she needs a stool aofterne then can buy OTC. Also if still constipated on the linzess then will need to see GI.  If sees any blood in stool then let me know.

## 2013-12-05 NOTE — Telephone Encounter (Signed)
Patient states her diarrhea has resolved and her stool is back to a brown color.

## 2014-02-01 ENCOUNTER — Other Ambulatory Visit: Payer: Self-pay | Admitting: Family Medicine

## 2014-02-01 DIAGNOSIS — Z1231 Encounter for screening mammogram for malignant neoplasm of breast: Secondary | ICD-10-CM

## 2014-02-06 ENCOUNTER — Ambulatory Visit (INDEPENDENT_AMBULATORY_CARE_PROVIDER_SITE_OTHER): Payer: Medicare HMO

## 2014-02-06 DIAGNOSIS — Z1231 Encounter for screening mammogram for malignant neoplasm of breast: Secondary | ICD-10-CM

## 2014-02-16 ENCOUNTER — Ambulatory Visit (INDEPENDENT_AMBULATORY_CARE_PROVIDER_SITE_OTHER): Payer: Medicare HMO | Admitting: Family Medicine

## 2014-02-16 ENCOUNTER — Encounter: Payer: Self-pay | Admitting: Family Medicine

## 2014-02-16 VITALS — BP 127/68 | HR 65 | Wt 243.0 lb

## 2014-02-16 DIAGNOSIS — M1610 Unilateral primary osteoarthritis, unspecified hip: Secondary | ICD-10-CM

## 2014-02-16 DIAGNOSIS — Z23 Encounter for immunization: Secondary | ICD-10-CM

## 2014-02-16 DIAGNOSIS — I10 Essential (primary) hypertension: Secondary | ICD-10-CM

## 2014-02-16 DIAGNOSIS — M1611 Unilateral primary osteoarthritis, right hip: Secondary | ICD-10-CM

## 2014-02-16 MED ORDER — LOSARTAN POTASSIUM 50 MG PO TABS: 50.0000 mg | ORAL_TABLET | Freq: Every day | ORAL | Status: DC

## 2014-02-16 NOTE — Progress Notes (Signed)
   Subjective:    Patient ID: Terri Farrell, female    DOB: 1950/11/24, 63 y.o.   MRN: [id number]  Hypertension   Hypertension- Pt denies chest pain, SOB, dizziness, or heart palpitations.  Taking meds as directed w/o problems.  Denies medication side effects.  At last office visit, 2 months ago, we decreased her losartan to half of a tab. She's been doing well on this without any side effects or problems.  Hip osteoarthritis-she did see Dr. Benjamin Stain. He did a steroid injection. She said it actually worked well for about 2 weeks. He referred her to orthopedic surgeon in [place]. Sugar followup with him is in October. He did want her to lose about 23 pounds before surgery.She is now only about 3 pounds away from her goal.   Review of Systems     Objective:   Physical Exam  Constitutional: She is oriented to person, place, and time. She appears well-developed and well-nourished.  HENT:  Head: Normocephalic and atraumatic.  Cardiovascular: Normal rate, regular rhythm and normal heart sounds.   Pulmonary/Chest: Effort normal and breath sounds normal.  Neurological: She is alert and oriented to person, place, and time.  Skin: Skin is warm and dry.  Psychiatric: She has a normal mood and affect. Her behavior is normal.          Assessment & Plan:  Hypertension-well-controlled. Blood pressure looks fantastic. Was a new prescription for 50 mg of losartan. I will see her back in 4 months. In the interim if she feels like her blood pressures dropping low again then please come in and we can check and adjust her medication if needed. She's been absolutely fantastic job with weight loss over the last year.   Hip osteoarthritis-will be scheduled for knee replacement later this year.

## 2014-05-18 ENCOUNTER — Other Ambulatory Visit: Payer: Self-pay | Admitting: Family Medicine

## 2014-06-18 ENCOUNTER — Encounter: Payer: Self-pay | Admitting: Family Medicine

## 2014-06-18 ENCOUNTER — Ambulatory Visit (INDEPENDENT_AMBULATORY_CARE_PROVIDER_SITE_OTHER): Payer: Commercial Managed Care - HMO | Admitting: Family Medicine

## 2014-06-18 VITALS — BP 130/80 | HR 69 | Temp 98.0°F | Ht 64.0 in | Wt 253.0 lb

## 2014-06-18 DIAGNOSIS — I10 Essential (primary) hypertension: Secondary | ICD-10-CM | POA: Diagnosis not present

## 2014-06-18 DIAGNOSIS — Z6841 Body Mass Index (BMI) 40.0 and over, adult: Secondary | ICD-10-CM

## 2014-06-18 DIAGNOSIS — M1611 Unilateral primary osteoarthritis, right hip: Secondary | ICD-10-CM | POA: Diagnosis not present

## 2014-06-18 DIAGNOSIS — E669 Obesity, unspecified: Secondary | ICD-10-CM

## 2014-06-18 MED ORDER — LOSARTAN POTASSIUM 50 MG PO TABS: 50.0000 mg | ORAL_TABLET | Freq: Every day | ORAL | Status: DC

## 2014-06-18 MED ORDER — LINACLOTIDE 145 MCG PO CAPS: 145.0000 ug | ORAL_CAPSULE | Freq: Every day | ORAL | Status: DC
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
## 2014-06-19 LAB — COMPLETE METABOLIC PANEL WITH GFR
ALBUMIN: 3.9 g/dL (ref 3.5–5.2)
ALK PHOS: 55 U/L (ref 39–117)
ALT: 18 U/L (ref 0–35)
AST: 21 U/L (ref 0–37)
BUN: 15 mg/dL (ref 6–23)
CO2: 21 mEq/L (ref 19–32)
Calcium: 9.2 mg/dL (ref 8.4–10.5)
Chloride: 108 mEq/L (ref 96–112)
Creat: 0.64 mg/dL (ref 0.50–1.10)
GFR, Est African American: >89 mL/min
GFR, Est Non African American: >89 mL/min
Glucose, Bld: 70 mg/dL (ref 70–99)
POTASSIUM: 4.2 meq/L (ref 3.5–5.3)
SODIUM: 141 meq/L (ref 135–145)
TOTAL PROTEIN: 7 g/dL (ref 6.0–8.3)
Total Bilirubin: 0.3 mg/dL (ref 0.2–1.2)

## 2014-06-19 LAB — LIPID PANEL
CHOL/HDL RATIO: 2.7 ratio
Cholesterol: 200 mg/dL (ref 0–200)
HDL: 74 mg/dL (ref >39)
LDL Cholesterol: 113 mg/dL — ABNORMAL HIGH (ref 0–99)
Triglycerides: 63 mg/dL (ref <150)
VLDL: 13 mg/dL (ref 0–40)

## 2014-08-11 DIAGNOSIS — M1611 Unilateral primary osteoarthritis, right hip: Secondary | ICD-10-CM | POA: Diagnosis not present

## 2014-08-11 DIAGNOSIS — Z886 Allergy status to analgesic agent status: Secondary | ICD-10-CM | POA: Diagnosis not present

## 2014-08-11 DIAGNOSIS — W19XXXA Unspecified fall, initial encounter: Secondary | ICD-10-CM | POA: Diagnosis not present

## 2014-08-11 DIAGNOSIS — W1839XA Other fall on same level, initial encounter: Secondary | ICD-10-CM | POA: Diagnosis not present

## 2014-08-11 DIAGNOSIS — I10 Essential (primary) hypertension: Secondary | ICD-10-CM | POA: Diagnosis not present

## 2014-08-11 DIAGNOSIS — M25559 Pain in unspecified hip: Secondary | ICD-10-CM | POA: Diagnosis not present

## 2014-08-11 DIAGNOSIS — S7001XA Contusion of right hip, initial encounter: Secondary | ICD-10-CM | POA: Diagnosis not present

## 2014-08-11 DIAGNOSIS — E669 Obesity, unspecified: Secondary | ICD-10-CM | POA: Diagnosis not present

## 2014-08-11 DIAGNOSIS — S79911A Unspecified injury of right hip, initial encounter: Secondary | ICD-10-CM | POA: Diagnosis not present

## 2014-10-17 ENCOUNTER — Ambulatory Visit: Payer: Medicare HMO | Admitting: Family Medicine

## 2014-10-23 ENCOUNTER — Ambulatory Visit (INDEPENDENT_AMBULATORY_CARE_PROVIDER_SITE_OTHER): Payer: Commercial Managed Care - HMO | Admitting: Family Medicine

## 2014-10-23 ENCOUNTER — Encounter: Payer: Self-pay | Admitting: Family Medicine

## 2014-10-23 VITALS — BP 118/79 | HR 71 | Wt 255.0 lb

## 2014-10-23 DIAGNOSIS — N760 Acute vaginitis: Secondary | ICD-10-CM | POA: Diagnosis not present

## 2014-10-23 DIAGNOSIS — K5901 Slow transit constipation: Secondary | ICD-10-CM

## 2014-10-23 DIAGNOSIS — R21 Rash and other nonspecific skin eruption: Secondary | ICD-10-CM

## 2014-10-23 DIAGNOSIS — R208 Other disturbances of skin sensation: Secondary | ICD-10-CM

## 2014-10-23 DIAGNOSIS — I10 Essential (primary) hypertension: Secondary | ICD-10-CM | POA: Diagnosis not present

## 2014-10-23 DIAGNOSIS — H6122 Impacted cerumen, left ear: Secondary | ICD-10-CM

## 2014-10-23 DIAGNOSIS — K029 Dental caries, unspecified: Secondary | ICD-10-CM

## 2014-10-23 DIAGNOSIS — R200 Anesthesia of skin: Secondary | ICD-10-CM

## 2014-10-23 DIAGNOSIS — K219 Gastro-esophageal reflux disease without esophagitis: Secondary | ICD-10-CM

## 2014-10-23 LAB — WET PREP FOR TRICH, YEAST, CLUE
TRICH WET PREP: NONE SEEN
YEAST WET PREP: NONE SEEN

## 2014-10-23 MED ORDER — TRIAMCINOLONE ACETONIDE 0.5 % EX OINT: 1.0000 "application " | TOPICAL_OINTMENT | Freq: Every day | CUTANEOUS | Status: DC | PRN

## 2014-10-23 MED ORDER — METRONIDAZOLE 500 MG PO TABS: 500.0000 mg | ORAL_TABLET | Freq: Two times a day (BID) | ORAL | Status: DC

## 2014-10-23 MED ORDER — FLUCONAZOLE 150 MG PO TABS: 150.0000 mg | ORAL_TABLET | Freq: Once | ORAL | Status: DC

## 2014-10-23 NOTE — Progress Notes (Signed)
Subjective:    Patient ID: Terri Farrell, female    DOB: 10-14-1950, 64 y.o.   MRN: [id number]  HPI Hypertension- Pt denies chest pain, SOB, dizziness, or heart palpitations.  Taking meds as directed w/o problems.  Denies medication side effects.    GERD - quit taking her prevacid.  She has been taking Pepcid AC.    Constipation - She feels like the linzess is not working well.   She feel March and went to Ed.  She feels like she is off balance bc of her hip. Says she didn''t break anything.  She did hit her head on table.  She had neg head CT scan.    She has been having some numbness and tingling in her chin and around her mouth on the right side and has been going on for months. Happens about twcie a month. Never happens inside mouth on on tongue. Notices it more when lasy down at night and seems bette when she sits up.   Has had some spring allergies with itching and pressure in her ears. No visoin changes.    Thinks has a yeast infection with vaginal itching and irritain. Tried changing soaps.  But no relif. She hasn't tried anything OTC. No urinary sxs.  Had a rash on the back of her neck that was itchey.  Again thought maybe she was allergic to something. Says it would burn at times   Review of Systems     Objective:   Physical Exam  Constitutional: She is oriented to person, place, and time. She appears well-developed and well-nourished.  Using a cane to ambulate.  HENT:  Head: Normocephalic and atraumatic.  Right Ear: External ear normal.  Left Ear: External ear normal.  Nose: Nose normal.  Mouth/Throat: Oropharynx is clear and moist. No oropharyngeal exudate.  Right TM and canals are clear. Right canal is blocked with cerumen.she has several dental caries and a molar on the right that is extremely loose.  Eyes: Conjunctivae and EOM are normal. Pupils are equal, round, and reactive to light.  Neck: Neck supple. No thyromegaly present.  Cardiovascular: Normal rate,  regular rhythm and normal heart sounds.   Pulmonary/Chest: Effort normal and breath sounds normal. She has no wheezes.  Lymphadenopathy:    She has no cervical adenopathy.  Neurological: She is alert and oriented to person, place, and time.  Skin: Skin is warm and dry. Rash noted.  Few small papules witou any erythema on posterior neck.    Psychiatric: She has a normal mood and affect. Her behavior is normal.          Assessment & Plan:  HTN- Well controlled.  Continue current regimen. F/U in 6 months.   Cerumen impaction - irrigation performed of the left ear. Patient tolerated well.  Constipation - Recommend do a clean out with miralax or laxative and then restart  Linzess. Will check thyroid level.  GERD - Off of PPI. Now on H2 blocker. Still having some occasional reflux symptoms but otherwise doing okay.   Rash on posterior neck Will tx with topical steroid cream.    Vaginitis-wet prep collected. We'll go ahead with Diflucan.  Need for right hip replacement.  The orthopedist said that he would do her replacement when she had her dental issues taking care of. He felt like she was high risk for sepsis from dental caries.  Dental carries. I did complete a form for her that would hopefully make her little bit more  priority to have some dental work done.  Perioral numbness or tingling on the right-unclear etiology at this point. It only happens a couple times a month and just last a few minutes. She denies any actual pain or trauma or injury to the face. It doesn't seem to affect the tongue or the mouth. No swelling occurs such as an allergic reaction.we'll check her thyroid.

## 2014-10-23 NOTE — Addendum Note (Signed)
Addended by: Nani GasserMETHENEY, Berlyn Malina D on: 10/23/2014 09:13 PM   Modules accepted: Orders

## 2014-10-24 LAB — TSH: TSH: 1.429 u[IU]/mL (ref 0.350–4.500)

## 2014-10-24 NOTE — Progress Notes (Signed)
Quick Note:  All labs are normal. ______ 

## 2014-11-19 ENCOUNTER — Emergency Department
Admission: EM | Admit: 2014-11-19 | Discharge: 2014-11-19 | Disposition: A | Discharge status: Home / Self Care | Payer: Commercial Managed Care - HMO | Source: Home / Self Care | Attending: Family Medicine | Admitting: Family Medicine

## 2014-11-19 ENCOUNTER — Encounter: Payer: Self-pay | Admitting: *Deleted

## 2014-11-19 DIAGNOSIS — H8112 Benign paroxysmal vertigo, left ear: Secondary | ICD-10-CM

## 2014-11-19 MED ORDER — MECLIZINE HCL 25 MG PO TABS: ORAL_TABLET | ORAL | Status: DC

## 2014-11-19 NOTE — ED Notes (Signed)
Pt c/o dizziness x 2 wks. Reports a history of vertigo.

## 2014-11-19 NOTE — Discharge Instructions (Signed)
Benign Positional Vertigo Vertigo means you feel like you or your surroundings are moving when they are not. Benign positional vertigo is the most common form of vertigo. Benign means that the cause of your condition is not serious. Benign positional vertigo is more common in older adults. CAUSES  Benign positional vertigo is the result of an upset in the labyrinth system. This is an area in the middle ear that helps control your balance. This may be caused by a viral infection, head injury, or repetitive motion. However, often no specific cause is found. SYMPTOMS  Symptoms of benign positional vertigo occur when you move your head or eyes in different directions. Some of the symptoms may include:  Loss of balance and falls.  Vomiting.  Blurred vision.  Dizziness.  Nausea.  Involuntary eye movements (nystagmus). DIAGNOSIS  Benign positional vertigo is usually diagnosed by physical exam. If the specific cause of your benign positional vertigo is unknown, your caregiver may perform imaging tests, such as magnetic resonance imaging (MRI) or computed tomography (CT). TREATMENT  Your caregiver may recommend movements or procedures to correct the benign positional vertigo. Medicines such as meclizine, benzodiazepines, and medicines for nausea may be used to treat your symptoms. In rare cases, if your symptoms are caused by certain conditions that affect the inner ear, you may need surgery. HOME CARE INSTRUCTIONS   Follow your caregiver's instructions.  Move slowly. Do not make sudden body or head movements.  Avoid driving.  Avoid operating heavy machinery.  Avoid performing any tasks that would be dangerous to you or others during a vertigo episode.  Drink enough fluids to keep your urine clear or pale yellow. SEEK IMMEDIATE MEDICAL CARE IF:   You develop problems with walking, weakness, numbness, or using your arms, hands, or legs.  You have difficulty speaking.  You develop  severe headaches.  Your nausea or vomiting continues or gets worse.  You develop visual changes.  Your family or friends notice any behavioral changes.  Your condition gets worse.  You have a fever.  You develop a stiff neck or sensitivity to light. MAKE SURE YOU:   Understand these instructions.  Will watch your condition.  Will get help right away if you are not doing well or get worse. Document Released: 03/09/2006 Document Revised: 08/24/2011 Document Reviewed: 02/19/2011 Exit[employer] Patient Information 2015 [employer]. This information is not intended to replace advice given to you by your health care provider. Make sure you discuss any questions you have with your health care provider.    

## 2014-11-19 NOTE — ED Provider Notes (Signed)
CSN: [id number]     Arrival date & time 11/19/14  1046 History   First MD Initiated Contact with Patient 11/19/14 1213     Chief Complaint  Patient presents with  . Dizziness      HPI Comments: Patient complains of recurrent episodes of brief vertigo that began 3 weeks ago.  With movement she develops a spinning sensation that lasts several seconds.  She notes decreased hearing and sensation of right in her ears.  She has no localizing neurologic symptoms.  She had a similar occurrence about 5 years ago that resolved spontaneously after one month.  No nausea/vomiting.  No fevers, chills, and sweats  Patient is a 64 y.o. female presenting with dizziness. The history is provided by the patient.  Dizziness Quality:  Head spinning, imbalance and room spinning Severity:  Mild Onset quality:  Sudden Duration:  3 weeks Timing:  Intermittent Progression:  Unchanged Chronicity:  Recurrent Context: bending over, head movement, physical activity and standing up   Context: not with ear pain, not with eye movement, not with loss of consciousness, not with medication and not when urinating   Relieved by:  Being still and change in position Worsened by:  Movement and turning head Ineffective treatments:  None tried Associated symptoms: tinnitus   Associated symptoms: no chest pain, no diarrhea, no headaches, no hearing loss, no nausea, no palpitations, no shortness of breath, no syncope, no vision changes, no vomiting and no weakness   Risk factors: hx of vertigo   Risk factors: no new medications     Past Medical History  Diagnosis Date  . Depression   . Arthritis     RA   Past Surgical History  Procedure Laterality Date  . Cesarean section      X 2  . Carpal tunnel release      right and left  . Abdominal hysterectomy  1996    for fibroids  . Total knee arthroplasty  08/2010   Family History  Problem Relation Age of Onset  . Diabetes Other     family hx of  . Breast cancer Paternal  Aunt    History  Substance Use Topics  . Smoking status: Former Smoker -- 1.00 packs/day for 2 years    Types: Cigarettes  . Smokeless tobacco: Not on file  . Alcohol Use: No   OB History    No data available     Review of Systems  HENT: Positive for tinnitus. Negative for hearing loss.   Respiratory: Negative for shortness of breath.   Cardiovascular: Negative for chest pain, palpitations and syncope.  Gastrointestinal: Negative for nausea, vomiting and diarrhea.  Neurological: Positive for dizziness. Negative for weakness and headaches.  All other systems reviewed and are negative.   Allergies  Lisinopril  Home Medications   Prior to Admission medications   Medication Sig Start Date End Date Taking? Authorizing Provider  famotidine (PEPCID AC) 10 MG chewable tablet Chew 10 mg by mouth 2 (two) times daily as needed for heartburn.    Historical Provider, MD  Linaclotide Karlene Einstein) 145 MCG CAPS capsule Take 1 capsule (145 mcg total) by mouth daily. 06/18/14   Agapito Games, MD  losartan (COZAAR) 50 MG tablet Take 1 tablet (50 mg total) by mouth daily. 06/18/14   Agapito Games, MD  meclizine (ANTIVERT) 25 MG tablet Take one tab by mouth 2 or 3 times daily as needed for dizziness 11/19/14   Lattie Haw, MD  triamcinolone ointment (  KENALOG) 0.5 % Apply 1 application topically daily as needed. 10/23/14   Agapito Gamesatherine D Metheney, MD   BP 146/83 mmHg  Pulse 83  Temp(Src) 97.8 F (36.6 C) (Oral)  Resp 18  SpO2 96% Physical Exam Nursing notes and Vital Signs reviewed. Appearance:  Patient appears stated age, and in no acute distress Eyes:  Pupils are equal, round, and reactive to light and accomodation.  Extraocular movement is intact.  Conjunctivae are not inflamed.  Fundi benign.  Minimal left nystagmus  Ears:  Canals normal.  Tympanic membranes normal.  Nose:  Mildly congested turbinates.  No sinus tenderness.   Pharynx:  Normal Neck:  Supple.  No adenopathy.  No  carotid bruits Lungs:  Clear to auscultation.  Breath sounds are equal.  Heart:  Regular rate and rhythm without murmurs, rubs, or gallops.  Abdomen:  Nontender without masses or hepatosplenomegaly.  Bowel sounds are present.  No CVA or flank tenderness.  Extremities:  No edema.  No calf tenderness Skin:  No rash present.  Neurologic:  Cranial nerves 2 through 12 are normal.  Patellar and achilles reflexes are normal.  Cerebellar function is intact (finger-to-nose and rapid alternating hand movement).    ED Course  Procedures  none   MDM   1. Benign paroxysmal positional vertigo, left    Begin Antivert 25mg  BID to TID. Followup with ENT if not improved two weeks. If symptoms become significantly worse during the night or over the weekend, proceed to the local emergency room.     Lattie HawStephen A Annalyssa Thune, MD 11/19/14 1414

## 2014-11-22 ENCOUNTER — Telehealth: Payer: Self-pay | Admitting: Emergency Medicine

## 2014-12-10 ENCOUNTER — Other Ambulatory Visit: Payer: Self-pay | Admitting: Family Medicine

## 2014-12-20 ENCOUNTER — Ambulatory Visit: Payer: Commercial Managed Care - HMO | Admitting: Family Medicine

## 2014-12-25 ENCOUNTER — Encounter: Payer: Self-pay | Admitting: Family Medicine

## 2014-12-25 ENCOUNTER — Ambulatory Visit (INDEPENDENT_AMBULATORY_CARE_PROVIDER_SITE_OTHER): Payer: Commercial Managed Care - HMO | Admitting: Family Medicine

## 2014-12-25 VITALS — BP 138/65 | HR 78 | Ht 64.0 in | Wt 261.0 lb

## 2014-12-25 DIAGNOSIS — Z1159 Encounter for screening for other viral diseases: Secondary | ICD-10-CM | POA: Diagnosis not present

## 2014-12-25 DIAGNOSIS — Z114 Encounter for screening for human immunodeficiency virus [HIV]: Secondary | ICD-10-CM | POA: Diagnosis not present

## 2014-12-25 DIAGNOSIS — K5900 Constipation, unspecified: Secondary | ICD-10-CM | POA: Diagnosis not present

## 2014-12-25 DIAGNOSIS — K5909 Other constipation: Secondary | ICD-10-CM

## 2014-12-25 DIAGNOSIS — K219 Gastro-esophageal reflux disease without esophagitis: Secondary | ICD-10-CM

## 2014-12-25 DIAGNOSIS — I10 Essential (primary) hypertension: Secondary | ICD-10-CM | POA: Diagnosis not present

## 2014-12-25 DIAGNOSIS — H8110 Benign paroxysmal vertigo, unspecified ear: Secondary | ICD-10-CM | POA: Diagnosis not present

## 2014-12-25 LAB — BASIC METABOLIC PANEL
BUN: 18 mg/dL (ref 6–23)
CO2: 20 mEq/L (ref 19–32)
Calcium: 9.6 mg/dL (ref 8.4–10.5)
Chloride: 110 mEq/L (ref 96–112)
Creat: 0.79 mg/dL (ref 0.50–1.10)
Glucose, Bld: 83 mg/dL (ref 70–99)
POTASSIUM: 4.2 meq/L (ref 3.5–5.3)
Sodium: 140 mEq/L (ref 135–145)

## 2014-12-25 MED ORDER — LUBIPROSTONE 24 MCG PO CAPS: 24.0000 ug | ORAL_CAPSULE | Freq: Two times a day (BID) | ORAL | Status: DC

## 2014-12-25 NOTE — Addendum Note (Signed)
Addended by: Deno EtienneBARKLEY, Valeria Krisko L on: 12/25/2014 10:59 AM   Modules accepted: Orders

## 2014-12-25 NOTE — Progress Notes (Signed)
   Subjective:    Patient ID: Terri Farrell, female    DOB: [birth year], 64 y.o.   MRN: [id number]  [facility].  When it hits it is intense.  She has been nauseated today.  Can take the meclizine and it does help. She hasn't been doing any exercise.    Hypertension- Pt denies chest pain, SOB, dizziness, or heart palpitations.  Taking meds as directed w/o problems.  Denies medication side effects.    GERD - she is on Pepcid and says work ok but still having some breakthrough sxs.  But has been nauseated with the vertigo.   Constipation -  She takes Linzess.  Says takes it regularly.  Still feels constipated.  She drinks fair amount of water most days.    Review of Systems     Objective:   Physical Exam  Constitutional: She is oriented to person, place, and time. She appears well-developed and well-nourished.  HENT:  Head: Normocephalic and atraumatic.  Right Ear: External ear normal.  Left Ear: External ear normal.  Nose: Nose normal.  Mouth/Throat: Oropharynx is clear and moist.  TMs and canals are clear.   Eyes: Conjunctivae and EOM are normal. Pupils are equal, round, and reactive to light.  Neck: Neck supple. No thyromegaly present.  Cardiovascular: Normal rate, regular rhythm and normal heart sounds.   Pulmonary/Chest: Effort normal and breath sounds normal. She has no wheezes.  Lymphadenopathy:    She has no cervical adenopathy.  Neurological: She is alert and oriented to person, place, and time.  Skin: Skin is warm and dry.  Psychiatric: She has a normal mood and affect.          Assessment & Plan:  BPV - given exercises to do on her own. If not feeling better the next 3 weeks and please let me know and can consider ENT referral at that time for formal physical therapy for the vertigo.  HTN - well controlled.  She has gained about 6 lbs back.  Continue current regimen. Follow-up in 6 months. Due for BMP today.  Constipation - will  change to amitiza and see if more effective than the linzess.  I like to see her back in about 3-4 months to see if this change has been helpful for her not.  GERD - consider changing to a PPI. It's difficult to say if she's been more nauseated with the vertigo and that certainly could be contributing to her symptoms. She's also been lying down more because of the vertigo again which could be contributing to her reflux.  Due for screening for HIV and hepatitis C.

## 2014-12-26 LAB — HIV ANTIBODY (ROUTINE TESTING W REFLEX): HIV 1&2 Ab, 4th Generation: NONREACTIVE

## 2014-12-26 LAB — HEPATITIS C ANTIBODY: HCV AB: NEGATIVE

## 2015-02-25 ENCOUNTER — Ambulatory Visit: Payer: Commercial Managed Care - HMO | Admitting: Family Medicine

## 2015-03-04 ENCOUNTER — Other Ambulatory Visit: Payer: Self-pay | Admitting: *Deleted

## 2015-03-04 MED ORDER — LUBIPROSTONE 24 MCG PO CAPS: 24.0000 ug | ORAL_CAPSULE | Freq: Two times a day (BID) | ORAL | Status: DC

## 2015-03-14 ENCOUNTER — Other Ambulatory Visit: Payer: Self-pay | Admitting: Family Medicine

## 2015-03-14 MED ORDER — LOSARTAN POTASSIUM 50 MG PO TABS: 50.0000 mg | ORAL_TABLET | Freq: Every day | ORAL | Status: DC

## 2015-04-26 ENCOUNTER — Encounter: Payer: Self-pay | Admitting: Family Medicine

## 2015-04-26 ENCOUNTER — Ambulatory Visit (INDEPENDENT_AMBULATORY_CARE_PROVIDER_SITE_OTHER): Payer: Commercial Managed Care - HMO | Admitting: Family Medicine

## 2015-04-26 VITALS — BP 142/65 | HR 88 | Temp 97.8°F | Resp 20 | Wt 245.8 lb

## 2015-04-26 DIAGNOSIS — K5909 Other constipation: Secondary | ICD-10-CM

## 2015-04-26 DIAGNOSIS — I10 Essential (primary) hypertension: Secondary | ICD-10-CM | POA: Diagnosis not present

## 2015-04-26 DIAGNOSIS — F411 Generalized anxiety disorder: Secondary | ICD-10-CM | POA: Diagnosis not present

## 2015-04-26 DIAGNOSIS — K5900 Constipation, unspecified: Secondary | ICD-10-CM

## 2015-04-26 DIAGNOSIS — H6983 Other specified disorders of Eustachian tube, bilateral: Secondary | ICD-10-CM

## 2015-04-26 MED ORDER — FLUTICASONE PROPIONATE 50 MCG/ACT NA SUSP: 2.0000 | Freq: Every day | NASAL | Status: DC

## 2015-04-26 MED ORDER — SERTRALINE HCL 50 MG PO TABS: ORAL_TABLET | ORAL | Status: DC

## 2015-04-26 NOTE — Progress Notes (Signed)
Subjective:    Patient ID: Terri Farrell, female    DOB: 04-28-1951, 64 y.o.   MRN: [id number]  HPI Say when she takes the [place] it gives her diarrhea. Says she will loose her appetite for a week or two at a time.   Then she feels like she wants to ovry per week or2 nd she knows that is not good for her body.  She feels really anxious as well. She's also had a few day where she's felt down. She took a medicatoin in her 59s when she had a nervous breakdown.  Says no new or unusual stressors.  No thoughts of wanting to harm herselg.  She feels overwhelmed.  she denies any thoughts of going to harm herself she does complain of feing down depressed more tan half the days as well as feeling bad about hrself half the days. SHE ALSO REPORTS DIFFICULTY CONCENTRATING> Shefels nervous and on edge more than half the days potassium and feels lishe is esily anoyemos very dayshe feels like she wories excessively.  Obesity/ BMI > 40 - she has lost about 15 more lbs. She is tryin got get her BMI > 40. She really needs her hip surgery.   Hypertension- Pt denies chest pain, SOB, dizziness, or heart palpitations.  Taking meds as directed w/o problems.  Denies medication side effects.    She also complains of bilateral ear pressure and feeling like they are clogged. It started about 2 weeks ago. She has been trying to get her ears to pop by blowing her nose. It does help temporarily. She denies any drainage fevers chills or other recent upper respiratory symptoms.  Review of Systems     Objective:   Physical Exam  Constitutional: She is oriented to person, place, and time. She appears well-developed and well-nourished.  HENT:  Head: Normocephalic and atraumatic.  Right Ear: External ear normal.  Left Ear: External ear normal.  Nose: Nose normal.  TMs and canals are clear.   Eyes: Conjunctivae and EOM are normal. Pupils are equal, round, and reactive to light.  Neck: Neck supple. No thyromegaly present.   Cardiovascular: Normal rate, regular rhythm and normal heart sounds.   Pulmonary/Chest: Effort normal and breath sounds normal. She has no wheezes.  Lymphadenopathy:    She has no cervical adenopathy.  Neurological: She is alert and oriented to person, place, and time.  Skin: Skin is warm and dry.  Psychiatric: She has a normal mood and affect.          Assessment & Plan:  Chronic constipation -  Stop the amitiza.  She didn't do well with Linzess either.  Can just use Miralax PRN.    Obesity/ BMI > 40 - she has lost 15 more lbs. I reallly want to get her anxiety down.  I think this will help. Consider low dose phentermien since no cardiac problems and BP is well controlled.   Eustachian tube dysfunction - recommend trial of nasal steroid spray. She said we have prescribed this before but when she got at the device didn't work itself. Encouraged her to take it back to the pharmacy if this happens because they should be replace it at the device itself is defective.  Anxiety - GAd 7 score of  17. PHQ 9 score of 16. Discussed options including referring her to a therapist/ounselor versus starting  Medication.  She opted to try medication. We'll start with Zoloft. Discussed potential side effects. Call if any problems or  concerns. Otherwise follow-up in one month to see how she's doing and to adjust the dose if needed.  HTN - BP mildly elevated today. We'll recheck at her follow-up in one month.

## 2015-05-22 ENCOUNTER — Other Ambulatory Visit: Payer: Self-pay | Admitting: Family Medicine

## 2015-05-24 ENCOUNTER — Encounter: Payer: Self-pay | Admitting: Family Medicine

## 2015-05-24 ENCOUNTER — Ambulatory Visit (INDEPENDENT_AMBULATORY_CARE_PROVIDER_SITE_OTHER): Payer: Commercial Managed Care - HMO | Admitting: Family Medicine

## 2015-05-24 VITALS — BP 135/68 | HR 60 | Wt 245.0 lb

## 2015-05-24 DIAGNOSIS — R7301 Impaired fasting glucose: Secondary | ICD-10-CM | POA: Diagnosis not present

## 2015-05-24 DIAGNOSIS — F418 Other specified anxiety disorders: Secondary | ICD-10-CM

## 2015-05-24 DIAGNOSIS — R7309 Other abnormal glucose: Secondary | ICD-10-CM

## 2015-05-24 LAB — POCT GLYCOSYLATED HEMOGLOBIN (HGB A1C): HEMOGLOBIN A1C: 5.2

## 2015-05-24 MED ORDER — LOSARTAN POTASSIUM 50 MG PO TABS: 50.0000 mg | ORAL_TABLET | Freq: Every day | ORAL | Status: DC

## 2015-05-24 MED ORDER — PHENTERMINE HCL 15 MG PO CAPS: 15.0000 mg | ORAL_CAPSULE | ORAL | Status: DC

## 2015-05-24 NOTE — Progress Notes (Signed)
   Subjective:    Patient ID: Terri Farrell, female    DOB: 04/22/1951, 64 y.o.   MRN: [id number]  HPI Anxiety/depression- previous CAD 7 score of 17 and PHQ 9 score of 16. We opted to start medication at her visit 4 weeks ago. Started her on Zoloft.  She is back to cleaning her house.  She feel more in control. She is now sleeping 6 hours straight. She denies any side effects. She wants to see a therpist or  Counselor.   Morbid obesity/BMI 42-she is on fantastic job in losing weight up until this point but unfortunately she has plateaued over the last several months and says she is just really struggling. She is at the point where she would like to consider some assistance to help her lose weight. Next  Abnormal glucose-no increased thirst or urination. Last hemoglobin A1c was 5.7.   Review of Systems     Objective:   Physical Exam  Constitutional: She is oriented to person, place, and time. She appears well-developed and well-nourished.  HENT:  Head: Normocephalic and atraumatic.  Cardiovascular: Normal rate, regular rhythm and normal heart sounds.   Pulmonary/Chest: Effort normal and breath sounds normal.  Neurological: She is alert and oriented to person, place, and time.  Skin: Skin is warm and dry.  Psychiatric: She has a normal mood and affect. Her behavior is normal.          Assessment & Plan:  Anxiety/depression- will place referral for therapy. She is doing absolutely fantastic. PHQ 9 score of 4 today and Gad 7 score of 4. She rates her symptoms as not difficult at all. Significant improvement from a month ago. She's very happy with the medication regimen and is not experiencing any side effects.  Abnormal glucose - hemoglobin A1c was down to 5.2 today which is absolutely fantastic. She is back into the normal range.  Morbid obesity/BMI greater than 40-we discussed options. Her Medicare will not pay for any weight loss medications. She does not qualify for diabetic  medications at this time. We discussed possibly doing a low-dose phentermine. She has no prior history of cardiac problems. Her blood pressure has been well controlled recently. We'll start with phentermine 15 mg and see her back in one month. I did warn about potential side effects and she is to stop the medication immediately if she expresses any chest pain shortness of breath palpitations or chest pressure.  Declined shingles vaccine.

## 2015-05-31 ENCOUNTER — Other Ambulatory Visit: Payer: Self-pay | Admitting: Family Medicine

## 2015-06-03 ENCOUNTER — Telehealth: Payer: Self-pay

## 2015-06-03 MED ORDER — ESCITALOPRAM OXALATE 10 MG PO TABS: 10.0000 mg | ORAL_TABLET | Freq: Every day | ORAL | Status: DC

## 2015-06-03 NOTE — Telephone Encounter (Signed)
Patient states the sertraline is making her hallucinate. She would like to try a different medication. Please advise.

## 2015-06-03 NOTE — Telephone Encounter (Signed)
Spoke to pt, she will cut the pill in half for the next 5 days, then switch to lexapro.  Also will reschedule her Jan appt for 5wks out. For the med recheck.

## 2015-06-03 NOTE — Telephone Encounter (Signed)
Cut sertraline in half for 5 days and then stop. Will change her to lexapro.  Then needs f/u with med in 5 weeks.

## 2015-06-21 ENCOUNTER — Ambulatory Visit: Payer: Commercial Managed Care - HMO | Admitting: Family Medicine

## 2015-07-22 ENCOUNTER — Ambulatory Visit: Payer: Commercial Managed Care - HMO | Admitting: Family Medicine

## 2015-07-22 ENCOUNTER — Encounter: Payer: Self-pay | Admitting: Family Medicine

## 2015-07-22 ENCOUNTER — Ambulatory Visit (INDEPENDENT_AMBULATORY_CARE_PROVIDER_SITE_OTHER): Payer: Commercial Managed Care - HMO | Admitting: Family Medicine

## 2015-07-22 VITALS — BP 135/62 | HR 56 | Ht 64.0 in | Wt 241.0 lb

## 2015-07-22 DIAGNOSIS — F418 Other specified anxiety disorders: Secondary | ICD-10-CM

## 2015-07-22 DIAGNOSIS — R635 Abnormal weight gain: Secondary | ICD-10-CM

## 2015-07-22 DIAGNOSIS — I10 Essential (primary) hypertension: Secondary | ICD-10-CM

## 2015-07-22 MED ORDER — LOSARTAN POTASSIUM-HCTZ 50-12.5 MG PO TABS
1.0000 | ORAL_TABLET | Freq: Every day | ORAL | Status: DC
Start: 2015-07-22 — End: 2016-07-10

## 2015-07-22 MED ORDER — PHENTERMINE HCL 15 MG PO CAPS: 15.0000 mg | ORAL_CAPSULE | ORAL | Status: DC

## 2015-07-22 NOTE — Progress Notes (Signed)
   Subjective:    Patient ID: Terri Farrell, female    DOB: [birth year], 65 y.o.   MRN: [id number]  HPI Abnormal  BMI /BMI 41 - Has lost about 4 lbs since I last saw her. She is not on her phentermine.  She decided to hold it while starting the anti-depressant medication.    Hypertension- Pt denies chest pain, SOB, dizziness, or heart palpitations.  Taking meds as directed w/o problems.  Denies medication side effects.  Feels like she has been holding fluid.     Depression and Anxiety - she didn't do well on the sertraline. She was getting hallucinations.  She is doing well on the Lexapr. Has been on it for about 5 weeks.  Safar she's doing well and without any side effects on the Lexapro. She does complain of feeling nervous and on edge several days of the week. She also complains of feeling down and later interest in doing things several days a week. She also complains of low energy and overeating. She denies thoughts of wanting to harm herself.   Review of Systems     Objective:   Physical Exam  Constitutional: She is oriented to person, place, and time. She appears well-developed and well-nourished.  HENT:  Head: Normocephalic and atraumatic.  Cardiovascular: Normal rate, regular rhythm and normal heart sounds.   Pulmonary/Chest: Effort normal and breath sounds normal.  Neurological: She is alert and oriented to person, place, and time.  Skin: Skin is warm and dry.  Psychiatric: She has a normal mood and affect. Her behavior is normal.          Assessment & Plan:  Abnormal  Wt gain /BMI 41 - she is still losing weight on her own but struggling with food choices. She plans to restart the phentermine this month.    HTN - well controlled normally.  Will recheck today.  Will follow.  She has lost 4 more lbs!!    Depression and Anxiety -  PHQ- 9 score of 11 and GAD-7 score of 2.  Continue Lexapro. F/U in 2 months.

## 2015-07-28 ENCOUNTER — Other Ambulatory Visit: Payer: Self-pay | Admitting: Family Medicine

## 2015-08-19 ENCOUNTER — Encounter: Payer: Commercial Managed Care - HMO | Admitting: Family Medicine

## 2015-09-19 ENCOUNTER — Encounter: Payer: Self-pay | Admitting: Family Medicine

## 2015-09-19 ENCOUNTER — Ambulatory Visit (INDEPENDENT_AMBULATORY_CARE_PROVIDER_SITE_OTHER): Payer: Commercial Managed Care - HMO | Admitting: Family Medicine

## 2015-09-19 VITALS — BP 133/65 | HR 61 | Wt 238.0 lb

## 2015-09-19 DIAGNOSIS — F331 Major depressive disorder, recurrent, moderate: Secondary | ICD-10-CM

## 2015-09-19 DIAGNOSIS — I10 Essential (primary) hypertension: Secondary | ICD-10-CM

## 2015-09-19 MED ORDER — DULOXETINE HCL 30 MG PO CPEP: 30.0000 mg | ORAL_CAPSULE | Freq: Every day | ORAL | Status: DC

## 2015-09-19 NOTE — Patient Instructions (Signed)
Cut the Lexapro in half. Take half a tab daily for 10 days and then switch to the new medication called duloxetine.

## 2015-09-19 NOTE — Progress Notes (Signed)
   Subjective:    Patient ID: Terri Farrell, female    DOB: [birth year], 65 y.o.   MRN: [id number]  HPI F/U depression - mood phq-9=21 very difficult gad-7=16 very difficult pt reports that she can't tell a difference with the medication. Her mom is actually been in and out of the hospital on the last couple months and this is been really stressful for her. She actually feels like her mood has declined. She still taking the Lexapro daily. We actually made her behavioral health referral back in February but she says she actually never heard from anybody. She says it is possible that she missed because she's been staying at her mom's some.   Hypertension- Pt denies chest pain, SOB, dizziness, or heart palpitations.  Taking meds as directed w/o problems.  Denies medication side effects.      Review of Systems     Objective:   Physical Exam  Constitutional: She is oriented to person, place, and time. She appears well-developed and well-nourished.  HENT:  Head: Normocephalic and atraumatic.  Cardiovascular: Normal rate, regular rhythm and normal heart sounds.   Pulmonary/Chest: Effort normal and breath sounds normal.  Neurological: She is alert and oriented to person, place, and time.  Skin: Skin is warm and dry.  Psychiatric: She has a normal mood and affect. Her behavior is normal.          Assessment & Plan:  Depression-PHQ 9 score of 21 and GAD 7 score of 16. Symptoms are uncontrolled. Find out what happened with the referral for behavioral health and try to get her an appointment. I think this would be usually important for her as she has had a lot of stressors over the last 6-12 months. We also discussed adjusting her medication. I'm going to wean off the Lexapro and twice in her to Cymbalta which is an S NRI. She did not do well with sertraline in the past.  Hyperpretension-well-controlled today. Continue current regimen. She admits she hasn't been doing the best with her dietary  choices. Encouraged her to get back on track with diet and exercise. That she has managed to lose a couple pounds since I last saw her.

## 2015-10-31 ENCOUNTER — Ambulatory Visit (INDEPENDENT_AMBULATORY_CARE_PROVIDER_SITE_OTHER): Payer: Commercial Managed Care - HMO | Admitting: Family Medicine

## 2015-10-31 ENCOUNTER — Encounter: Payer: Self-pay | Admitting: Family Medicine

## 2015-10-31 VITALS — BP 119/57 | HR 66 | Wt 241.0 lb

## 2015-10-31 DIAGNOSIS — I10 Essential (primary) hypertension: Secondary | ICD-10-CM

## 2015-10-31 DIAGNOSIS — F331 Major depressive disorder, recurrent, moderate: Secondary | ICD-10-CM | POA: Diagnosis not present

## 2015-10-31 LAB — LIPID PANEL
Cholesterol: 190 mg/dL (ref 125–200)
HDL: 73 mg/dL (ref >=46)
LDL CALC: 98 mg/dL (ref <130)
Total CHOL/HDL Ratio: 2.6 Ratio (ref <=5.0)
Triglycerides: 95 mg/dL (ref <150)
VLDL: 19 mg/dL (ref <30)

## 2015-10-31 LAB — COMPLETE METABOLIC PANEL WITH GFR
ALBUMIN: 4 g/dL (ref 3.6–5.1)
ALK PHOS: 37 U/L (ref 33–130)
ALT: 16 U/L (ref 6–29)
AST: 23 U/L (ref 10–35)
BUN: 17 mg/dL (ref 7–25)
CO2: 20 mmol/L (ref 20–31)
Calcium: 8.9 mg/dL (ref 8.6–10.4)
Chloride: 104 mmol/L (ref 98–110)
Creat: 0.85 mg/dL (ref 0.50–0.99)
GFR, EST NON AFRICAN AMERICAN: 73 mL/min (ref >=60)
GFR, Est African American: 84 mL/min (ref >=60)
GLUCOSE: 84 mg/dL (ref 65–99)
Potassium: 4.2 mmol/L (ref 3.5–5.3)
SODIUM: 138 mmol/L (ref 135–146)
Total Bilirubin: 0.2 mg/dL (ref 0.2–1.2)
Total Protein: 6.9 g/dL (ref 6.1–8.1)

## 2015-10-31 MED ORDER — VENLAFAXINE HCL ER 37.5 MG PO CP24: 37.5000 mg | ORAL_CAPSULE | Freq: Every day | ORAL | Status: DC

## 2015-10-31 NOTE — Addendum Note (Signed)
Addended by: Deno EtienneBARKLEY, Zahari Xiang L on: 10/31/2015 03:46 PM   Modules accepted: Orders

## 2015-10-31 NOTE — Progress Notes (Signed)
   Subjective:    Patient ID: Terri Farrell, female    DOB: 08-Aug-1950, 65 y.o.   MRN: [id number]  HPI Hypertension- Pt denies chest pain, SOB, dizziness, or heart palpitations.  Taking meds as directed w/o problems.  Denies medication side effects.    Follow-up depression with anxiety-patient reports that she is doing well overall. She still feels down and depressed several days a week and has some difficulty with sleep and low energy. She also has some difficulty with concentration. She also complains of some irritability. We weaned her off the Lexapro and changed her to Cymbalta.  She feels like the Cymbalta has worked fantastic to help reduce her depression and anxiety but unfortunately she says it has really increased her appetite and she says she wakes up with a hangover feeling in the mornings. Has noticed though that she's been able to walk more without her cane and even relatives have pointed this out to her.   Review of Systems     Objective:   Physical Exam  Constitutional: She is oriented to person, place, and time. She appears well-developed and well-nourished.  HENT:  Head: Normocephalic and atraumatic.  Cardiovascular: Normal rate, regular rhythm and normal heart sounds.   Pulmonary/Chest: Effort normal and breath sounds normal.  Neurological: She is alert and oriented to person, place, and time.  Skin: Skin is warm and dry.  Psychiatric: She has a normal mood and affect. Her behavior is normal.        Assessment & Plan:  HTN - Well controlled. Continue current regimen. Follow up in 6 months. Due for labs.   Depression/anxiety-PHQ 9 score of 13, previous of 21. GAD 7 score of 2, previous of 16. She now rates her symptoms as not difficult. We discussed several options. We could try a different S NRI such as Effexor and hopes that it will work similar to the Cymbalta to reduce her depression and anxiety but hopefully without the drowsiness and increased appetite that she  is experiencing with the Cymbalta. She did gain 3 pounds since I last saw her because she says she's been craving chips.

## 2015-10-31 NOTE — Patient Instructions (Signed)
Decrease cymbalta to every other day for 5 days and then stop and can start the new medication called venlafaxine ( Effexor).

## 2015-11-01 NOTE — Progress Notes (Signed)
Quick Note:  All labs are normal. ______ 

## 2015-11-22 ENCOUNTER — Ambulatory Visit (INDEPENDENT_AMBULATORY_CARE_PROVIDER_SITE_OTHER): Payer: Commercial Managed Care - HMO | Admitting: Family Medicine

## 2015-11-22 ENCOUNTER — Encounter: Payer: Self-pay | Admitting: Family Medicine

## 2015-11-22 VITALS — BP 108/56 | HR 66 | Wt 233.0 lb

## 2015-11-22 DIAGNOSIS — Z0000 Encounter for general adult medical examination without abnormal findings: Secondary | ICD-10-CM

## 2015-11-22 NOTE — Patient Instructions (Signed)
 Fall Prevention in the Home  Falls can cause injuries. They can happen to people of all ages. There are many things you can do to make your home safe and to help prevent falls.  WHAT CAN I DO ON THE OUTSIDE OF MY HOME?  Regularly fix the edges of walkways and driveways and fix any cracks.  Remove anything that might make you trip as you walk through a door, such as a raised step or threshold.  Trim any bushes or trees on the path to your home.  Use bright outdoor lighting.  Clear any walking paths of anything that might make someone trip, such as rocks or tools.  Regularly check to see if handrails are loose or broken. Make sure that both sides of any steps have handrails.  Any raised decks and porches should have guardrails on the edges.  Have any leaves, snow, or ice cleared regularly.  Use sand or salt on walking paths during winter.  Clean up any spills in your garage right away. This includes oil or grease spills. WHAT CAN I DO IN THE BATHROOM?   Use night lights.  Install grab bars by the toilet and in the tub and shower. Do not use towel bars as grab bars.  Use non-skid mats or decals in the tub or shower.  If you need to sit down in the shower, use a plastic, non-slip stool.  Keep the floor dry. Clean up any water that spills on the floor as soon as it happens.  Remove soap buildup in the tub or shower regularly.  Attach bath mats securely with double-sided non-slip rug tape.  Do not have throw rugs and other things on the floor that can make you trip. WHAT CAN I DO IN THE BEDROOM?  Use night lights.  Make sure that you have a light by your bed that is easy to reach.  Do not use any sheets or blankets that are too big for your bed. They should not hang down onto the floor.  Have a firm chair that has side arms. You can use this for support while you get dressed.  Do not have throw rugs and other things on the floor that can make you trip. WHAT CAN I DO  IN THE KITCHEN?  Clean up any spills right away.  Avoid walking on wet floors.  Keep items that you use a lot in easy-to-reach places.  If you need to reach something above you, use a strong step stool that has a grab bar.  Keep electrical cords out of the way.  Do not use floor polish or wax that makes floors slippery. If you must use wax, use non-skid floor wax.  Do not have throw rugs and other things on the floor that can make you trip. WHAT CAN I DO WITH MY STAIRS?  Do not leave any items on the stairs.  Make sure that there are handrails on both sides of the stairs and use them. Fix handrails that are broken or loose. Make sure that handrails are as long as the stairways.  Check any carpeting to make sure that it is firmly attached to the stairs. Fix any carpet that is loose or worn.  Avoid having throw rugs at the top or bottom of the stairs. If you do have throw rugs, attach them to the floor with carpet tape.  Make sure that you have a light switch at the top of the stairs and the bottom of the   If you do not have them, ask someone to add them for you. WHAT ELSE CAN I DO TO HELP PREVENT FALLS?  Wear shoes that:  Do not have high heels.  Have rubber bottoms.  Are comfortable and fit you well.  Are closed at the toe. Do not wear sandals.  If you use a stepladder:  Make sure that it is fully opened. Do not climb a closed stepladder.  Make sure that both sides of the stepladder are locked into place.  Ask someone to hold it for you, if possible.  Clearly mark and make sure that you can see:  Any grab bars or handrails.  First and last steps.  Where the edge of each step is.  Use tools that help you move around (mobility aids) if they are needed. These include:  Canes.  Walkers.  Scooters.  Crutches.  Turn on the lights when you go into a dark area. Replace any light bulbs as soon as they burn out.  Set up your furniture so you have a clear  path. Avoid moving your furniture around.  If any of your floors are uneven, fix them.  If there are any pets around you, be aware of where they are.  Review your medicines with your doctor. Some medicines can make you feel dizzy. This can increase your chance of falling. Ask your doctor what other things that you can do to help prevent falls.   This information is not intended to replace advice given to you by your health care provider. Make sure you discuss any questions you have with your health care provider.   Document Released: 03/28/2009 Document Revised: 10/16/2014 Document Reviewed: 07/06/2014 Elsevier Interactive Patient Education 2016 [employer].  Mammogram A mammogram is an X-ray of the breasts that is done to check for abnormal changes. This procedure can screen for and detect any changes that may suggest breast cancer. A mammogram can also identify other changes and variations in the breast, such as:  Inflammation of the breast tissue (mastitis).  An infected area that contains a collection of pus (abscess).  A fluid-filled sac (cyst).  Fibrocystic changes. This is when breast tissue becomes denser, which can make the tissue feel rope-like or uneven under the skin.  Tumors that are not cancerous (benign). LET [facility] CARE PROVIDER KNOW ABOUT:  Any allergies you have.  If you have breast implants.  If you have had previous breast disease, biopsy, or surgery.  If you are breastfeeding.  Any possibility that you could be pregnant, if this applies.  If you are younger than age 28.  If you have a family history of breast cancer. RISKS AND COMPLICATIONS Generally, this is a safe procedure. However, problems may occur, including:  Exposure to radiation. Radiation levels are very low with this test.  The results being misinterpreted.  The need for further tests.  The inability of the mammogram to detect certain cancers. BEFORE THE PROCEDURE  Schedule  your test about 1-2 weeks after your menstrual period. This is usually when your breasts are the least tender.  If you have had a mammogram done at a different facility in the past, get the mammogram X-rays or have them sent to your current exam facility in order to compare them.  Wash your breasts and under your arms the day of the test.  Do not wear deodorants, perfumes, lotions, or powders anywhere on your body on the day of the test.  Remove any jewelry from your  neck.  Wear clothes that you can change into and out of easily. PROCEDURE  You will undress from the waist up and put on a gown.  You will stand in front of the X-ray machine.  Each breast will be placed between two plastic or glass plates. The plates will compress your breast for a few seconds. Try to stay as relaxed as possible during the procedure. This does not cause any harm to your breasts and any discomfort you feel will be very brief.  X-rays will be taken from different angles of each breast. The procedure may vary among health care providers and hospitals. AFTER THE PROCEDURE  The mammogram will be examined by a specialist (radiologist).  You may need to repeat certain parts of the test, depending on the quality of the images. This is commonly done if the radiologist needs a better view of the breast tissue.  Ask when your test results will be ready. Make sure you get your test results.  You may resume your normal activities.   This information is not intended to replace advice given to you by your health care provider. Make sure you discuss any questions you have with your health care provider.   Document Released: 05/29/2000 Document Revised: 02/20/2015 Document Reviewed: 08/10/2014 Elsevier Interactive Patient Education [employer]2016 Elsevier Inc.

## 2015-11-22 NOTE — Progress Notes (Addendum)
Subjective:    Terri Farrell is a 65 y.o. female who presents for Medicare Annual/Subsequent preventive examination.  Preventive Screening-Counseling & Management  Tobacco History  Smoking status  . Former Smoker -- 1.00 packs/day for 2 years  . Types: Cigarettes  Smokeless tobacco  . Not on file     Problems Prior to Visit 1.   Current Problems (verified) Patient Active Problem List   Diagnosis Date Noted  . Chronic constipation 12/25/2014  . Osteoarthritis of right hip 11/21/2013  . GERD (gastroesophageal reflux disease) 11/16/2013  . Severe obesity (BMI >= 40) (HCC) 04/14/2013  . Essential hypertension, benign 05/13/2011  . Obesity 03/09/2011  . Insomnia 03/09/2011  . Anemia, iron deficiency 10/02/2010  . Depression, major, recurrent (HCC) 07/24/2010  . KNEE PAIN, RIGHT 07/24/2010  . POLYARTHRITIS 07/24/2010  . FATIGUE 07/16/2010    Medications Prior to Visit Current Outpatient Prescriptions on File Prior to Visit  Medication Sig Dispense Refill  . fluticasone (FLONASE) 50 MCG/ACT nasal spray Place 2 sprays into both nostrils daily. 16 g 6  . losartan-hydrochlorothiazide (HYZAAR) 50-12.5 MG tablet Take 1 tablet by mouth daily. 90 tablet 3  . venlafaxine XR (EFFEXOR XR) 37.5 MG 24 hr capsule Take 1 capsule (37.5 mg total) by mouth daily. 60 capsule 0   No current facility-administered medications on file prior to visit.    Current Medications (verified) Current Outpatient Prescriptions  Medication Sig Dispense Refill  . fluticasone (FLONASE) 50 MCG/ACT nasal spray Place 2 sprays into both nostrils daily. 16 g 6  . losartan-hydrochlorothiazide (HYZAAR) 50-12.5 MG tablet Take 1 tablet by mouth daily. 90 tablet 3  . venlafaxine XR (EFFEXOR XR) 37.5 MG 24 hr capsule Take 1 capsule (37.5 mg total) by mouth daily. 60 capsule 0   No current facility-administered medications for this visit.     Allergies (verified) Lisinopril and Sertraline   PAST  HISTORY  Family History Family History  Problem Relation Age of Onset  . Diabetes Other     family hx of  . Breast cancer Paternal Aunt     Social History Social History  Substance Use Topics  . Smoking status: Former Smoker -- 1.00 packs/day for 2 years    Types: Cigarettes  . Smokeless tobacco: Not on file  . Alcohol Use: No     Are there smokers in your home (other than you)? No  Risk Factors Current exercise habits: The patient does not participate in regular exercise at present.  Dietary issues discussed: she is back on track with diet and exercise.    Cardiac risk factors: obesity (BMI >= 30 kg/m2) and sedentary lifestyle.  Depression Screen (Note: if answer to either of the following is "Yes", a more complete depression screening is indicated)   Over the past two weeks, have you felt down, depressed or hopeless? Yes  Over the past two weeks, have you felt little interest or pleasure in doing things? Yes  Have you lost interest or pleasure in daily life? No  Do you often feel hopeless? No  Do you cry easily over simple problems? No  Activities of Daily Living In your present state of health, do you have any difficulty performing the following activities?:  Driving? No Managing money?  Yes Feeding yourself? No Getting from bed to chair? No   Climbing a flight of stairs? Yes Preparing food and eating?: No Bathing or showering? No Getting dressed: No Getting to the toilet? No Using the toilet:No Moving around from place to  place: No In the past year have you fallen or had a near fall?:Yes   Are you sexually active?  No  Do you have more than one partner?  No  Hearing Difficulties: No Do you often ask people to speak up or repeat themselves? No Do you experience ringing or noises in your ears? Yes Do you have difficulty understanding soft or whispered voices? Yes   Do you feel that you have a problem with memory? Yes  Do you often misplace items? Yes  Do  you feel safe at home?  Yes   Cognitive Testing  Alert? Yes  Normal Appearance?Yes  Oriented to person? Yes  Place? Yes   Time? Yes  Recall of three objects?  Yes  Can perform simple calculations? Yes  Displays appropriate judgment?Yes  Can read the correct time from a watch face?Yes    Passed 6 CIT   Advanced Directives have been discussed with the patient? Yes  List the Names of Other Physician/Practitioners you currently use: 1.    Indicate any recent Medical Services you may have received from other than [facility] providers in the past year (date may be approximate).  Immunization History  Administered Date(s) Administered  . Influenza Split 04/11/2012  . Influenza Whole 03/17/2013  . Influenza,inj,Quad PF,36+ Mos 02/16/2014  . Influenza-Unspecified 04/08/2015  . Tdap 06/12/2011    Screening Tests Health Maintenance  Topic Date Due  . ZOSTAVAX  05/23/2016 (Originally 08/29/2012)  . INFLUENZA VACCINE  01/14/2016  . MAMMOGRAM  02/07/2016  . TETANUS/TDAP  06/11/2021  . COLONOSCOPY  07/23/2021  . Hepatitis C Screening  Completed  . HIV Screening  Completed    All answers were reviewed with the patient and necessary referrals were made:  METHENEY,CATHERINE, MD   11/22/2015   History reviewed: allergies, current medications, past family history, past medical history, past social history, past surgical history and problem list  Review of Systems A comprehensive review of systems was negative.    Objective:     Vision by Snellen chart:Eye exam UTD   Body mass index is 39.97 kg/(m^2). BP 108/56 mmHg  Pulse 66  Wt 233 lb (105.688 kg)  SpO2 98%  BP 108/56 mmHg  Pulse 66  Wt 233 lb (105.688 kg)  SpO2 98% General appearance: alert, cooperative and appears stated age Head: Normocephalic, without obvious abnormality, atraumatic Eyes: conj clear, EOMI, PEERLA Ears: normal TM's and external ear canals both ears Nose: Nares normal. Septum midline. Mucosa normal. No  drainage or sinus tenderness. Throat: lips, mucosa, and tongue normal; teeth and gums normal Neck: no adenopathy, no carotid bruit, no JVD, supple, symmetrical, trachea midline and thyroid not enlarged, symmetric, no tenderness/mass/nodules Back: symmetric, no curvature. ROM normal. No CVA tenderness. Lungs: clear to auscultation bilaterally Breasts: normal appearance, no masses or tenderness Heart: regular rate and rhythm, S1, S2 normal, no murmur, click, rub or gallop Abdomen: soft, non-tender; bowel sounds normal; no masses,  no organomegaly Extremities: extremities normal, atraumatic, no cyanosis or edema Pulses: 2+ and symmetric Skin: Skin color, texture, turgor normal. No rashes or lesions Lymph nodes: Cervical, supraclavicular, and axillary nodes normal. Neurologic: Alert and oriented X 3, normal strength and tone. Normal symmetric reflexes. Normal coordination and gait     Assessment:     Medicare WEllness Exam       Plan:     During the course of the visit the patient was educated and counseled about appropriate screening and preventive services including:    Screening mammography  Vaccines up to date.   Recurrent depression-PHQ 9 score of 12 today. Unfortunately she just started her Effexor last night as she had some difficulty getting the medication at her pharmacy. We had weaned her off of the Cymbalta. She does feel like it helped her pain but says she felt too sedated and it was really increasing her hunger cravings.Follow-up in one month.  Diet review for nutrition referral? Yes ____  Not Indicated __x__   Patient Instructions (the written plan) was given to the patient.  Medicare Attestation I have personally reviewed: The patient's medical and social history Their use of alcohol, tobacco or illicit drugs Their current medications and supplements The patient's functional ability including ADLs,fall risks, home safety risks, cognitive, and hearing and visual  impairment Diet and physical activities Evidence for depression or mood disorders  The patient's weight, height, BMI, and visual acuity have been recorded in the chart.  I have made referrals, counseling, and provided education to the patient based on review of the above and I have provided the patient with a written personalized care plan for preventive services.     METHENEY,CATHERINE, MD   11/22/2015

## 2015-11-22 NOTE — Addendum Note (Signed)
Addended by: Nani GasserMETHENEY, CATHERINE D on: 11/22/2015 12:27 PM   Modules accepted: Kipp BroodSmartSet

## 2015-12-12 ENCOUNTER — Ambulatory Visit: Payer: Commercial Managed Care - HMO | Admitting: Family Medicine

## 2015-12-20 ENCOUNTER — Ambulatory Visit (INDEPENDENT_AMBULATORY_CARE_PROVIDER_SITE_OTHER): Payer: Commercial Managed Care - HMO | Admitting: Family Medicine

## 2015-12-20 ENCOUNTER — Encounter: Payer: Self-pay | Admitting: Family Medicine

## 2015-12-20 VITALS — BP 121/59 | HR 76 | Wt 243.0 lb

## 2015-12-20 DIAGNOSIS — R635 Abnormal weight gain: Secondary | ICD-10-CM

## 2015-12-20 DIAGNOSIS — F331 Major depressive disorder, recurrent, moderate: Secondary | ICD-10-CM | POA: Diagnosis not present

## 2015-12-20 DIAGNOSIS — R5383 Other fatigue: Secondary | ICD-10-CM

## 2015-12-20 MED ORDER — DULOXETINE HCL 30 MG PO CPEP: 30.0000 mg | ORAL_CAPSULE | Freq: Every day | ORAL | Status: DC

## 2015-12-20 NOTE — Progress Notes (Signed)
Subjective:    CC: Follow up depression  HPI: Moderate depressive disorder-I last saw her we had switched her off of Cymbalta onto Effexor. She felt like the Cymbalta was increasing her hunger and cravings for potato chips as well as causing a little bit of a hangover in the morning. Though she did feel like it worked well for her depressive symptoms and she felt like she was more mobile on it. She actually felt like it improved her hip pain. Fax her she is actually gained about 10 more pounds since I last saw her.   Past medical history, Surgical history, Family history not pertinant except as noted below, Social history, Allergies, and medications have been entered into the medical record, reviewed, and corrections made.   Review of Systems: No fevers, chills, night sweats, weight loss, chest pain, or shortness of breath.   Objective:    General: Well Developed, well nourished, and in no acute distress.  Neuro: Alert and oriented x3, extra-ocular muscles intact, sensation grossly intact.  HEENT: Normocephalic, atraumatic  Skin: Warm and dry, no rashes. Cardiac: Regular rate and rhythm, no murmurs rubs or gallops, no lower extremity edema.  Respiratory: Clear to auscultation bilaterally. Not using accessory muscles, speaking in full sentences.   Impression and Recommendations:   Recurrent major depressive disorder - will discontinue Effexor and put her back on Cymbalta. It did work well for mood and also helped with her hip pain.  F/U in 2 months.   Abnormal weight gain -  she plans on getting back on track with diet and exercise. She admits she's been snacking too much and still really wants to work towards losing weight to get her hip replacement.  Fatigue -consider checking thyroid. All her labs are up-to-date.

## 2015-12-21 LAB — TSH: TSH: 1.54 mIU/L

## 2015-12-23 NOTE — Progress Notes (Signed)
Quick Note:  All labs are normal. ______ 

## 2015-12-31 ENCOUNTER — Telehealth: Payer: Self-pay | Admitting: *Deleted

## 2015-12-31 NOTE — Telephone Encounter (Signed)
PA submitted over the phone(covermymeds site not working now) Ref # [id number]

## 2016-01-07 NOTE — Telephone Encounter (Signed)
PA approved for Duloxetine. Voicemail left on pt cell  Approved until 04/02/2016

## 2016-01-29 ENCOUNTER — Other Ambulatory Visit: Payer: Self-pay | Admitting: Family Medicine

## 2016-01-29 DIAGNOSIS — Z9289 Personal history of other medical treatment: Secondary | ICD-10-CM

## 2016-02-05 ENCOUNTER — Ambulatory Visit: Payer: Commercial Managed Care - HMO

## 2016-02-24 ENCOUNTER — Ambulatory Visit: Payer: Commercial Managed Care - HMO | Admitting: Family Medicine

## 2016-03-20 ENCOUNTER — Other Ambulatory Visit: Payer: Self-pay | Admitting: *Deleted

## 2016-03-20 DIAGNOSIS — F331 Major depressive disorder, recurrent, moderate: Secondary | ICD-10-CM

## 2016-03-20 MED ORDER — DULOXETINE HCL 30 MG PO CPEP: 30.0000 mg | ORAL_CAPSULE | Freq: Every day | ORAL | 0 refills | Status: DC

## 2016-04-15 ENCOUNTER — Ambulatory Visit (INDEPENDENT_AMBULATORY_CARE_PROVIDER_SITE_OTHER): Payer: Commercial Managed Care - HMO

## 2016-04-15 ENCOUNTER — Ambulatory Visit (INDEPENDENT_AMBULATORY_CARE_PROVIDER_SITE_OTHER): Payer: Commercial Managed Care - HMO | Admitting: Family Medicine

## 2016-04-15 ENCOUNTER — Encounter: Payer: Self-pay | Admitting: Family Medicine

## 2016-04-15 VITALS — BP 119/58 | HR 71 | Wt 221.0 lb

## 2016-04-15 DIAGNOSIS — D508 Other iron deficiency anemias: Secondary | ICD-10-CM | POA: Diagnosis not present

## 2016-04-15 DIAGNOSIS — Z6837 Body mass index (BMI) 37.0-37.9, adult: Secondary | ICD-10-CM

## 2016-04-15 DIAGNOSIS — Z1231 Encounter for screening mammogram for malignant neoplasm of breast: Secondary | ICD-10-CM | POA: Diagnosis not present

## 2016-04-15 DIAGNOSIS — E6609 Other obesity due to excess calories: Secondary | ICD-10-CM

## 2016-04-15 DIAGNOSIS — R5383 Other fatigue: Secondary | ICD-10-CM | POA: Diagnosis not present

## 2016-04-15 DIAGNOSIS — Z9289 Personal history of other medical treatment: Secondary | ICD-10-CM

## 2016-04-15 DIAGNOSIS — I10 Essential (primary) hypertension: Secondary | ICD-10-CM

## 2016-04-15 DIAGNOSIS — IMO0001 Reserved for inherently not codable concepts without codable children: Secondary | ICD-10-CM

## 2016-04-15 LAB — FERRITIN: FERRITIN: 10 ng/mL — AB (ref 20–288)

## 2016-04-15 NOTE — Progress Notes (Addendum)
Subjective:    Patient ID: Terri Farrell, female    DOB: [birth year], 65 y.o.   MRN: [id number]  HPI  She is here today because 3 weeks ago she had her flu shot and then about 10 days later started feeling extremely fatigued.  Just didn't fell well. Had some mild nasal congestion and no appetite. She felt so weak she couldn't prepar her own food so says she just didn't eat.. No fever. She is feeling better over the last couple of days. She feels her appetite if back to normal.  Wanted to come in to make sure everthing was ok.     Hypertension- Pt denies chest pain, SOB, dizziness, or heart palpitations.  Taking meds as directed w/o problems.  Denies medication side effects.     Obesity-she says she would be interested in weight loss surgery possibly. She's not sure if she is a candidate or not. Will refer for bariatric consult.  She has been under a lot of stress recently. Her mom is currently in hospice.  Review of Systems  BP (!) 119/58   Pulse 71   Wt 221 lb (100.2 kg)   SpO2 98%   BMI 37.93 kg/m     Allergies  Allergen Reactions  . Cymbalta [Duloxetine Hcl] Other (See Comments)    Sedation, inc hunger  . Lisinopril Other (See Comments)    Fatigue   . Sertraline Other (See Comments)    hallucinations    Past Medical History:  Diagnosis Date  . Arthritis    RA  . Depression     Past Surgical History:  Procedure Laterality Date  . ABDOMINAL HYSTERECTOMY  1996   for fibroids  . CARPAL TUNNEL RELEASE     right and left  . CESAREAN SECTION     X 2  . TOTAL KNEE ARTHROPLASTY  08/2010    Social History   Social History  . Marital status: Widowed    Spouse name: N/A  . Number of children: N/A  . Years of education: N/A   Occupational History  . Not on file.   Social History Main Topics  . Smoking status: Former Smoker    Packs/day: 1.00    Years: 2.00    Types: Cigarettes  . Smokeless tobacco: Not on file  . Alcohol use No  . Drug use: No  . Sexual  activity: Not on file   Other Topics Concern  . Not on file   Social History Narrative  . No narrative on file    Family History  Problem Relation Age of Onset  . Diabetes Other     family hx of  . Breast cancer Paternal Aunt     Outpatient Encounter Prescriptions as of 04/15/2016  Medication Sig  . DULoxetine (CYMBALTA) 30 MG capsule Take 1 capsule (30 mg total) by mouth daily. Please schedule a f/u appointment  . fluticasone (FLONASE) 50 MCG/ACT nasal spray Place 2 sprays into both nostrils daily.  Marland Kitchen. losartan-hydrochlorothiazide (HYZAAR) 50-12.5 MG tablet Take 1 tablet by mouth daily.   No facility-administered encounter medications on file as of 04/15/2016.          Objective:   Physical Exam  Constitutional: She is oriented to person, place, and time. She appears well-developed and well-nourished.  HENT:  Head: Normocephalic and atraumatic.  Right Ear: External ear normal.  Left Ear: External ear normal.  Nose: Nose normal.  Mouth/Throat: Oropharynx is clear and moist.  Nasal turbinates are very pale.  TMs and canals are clear.   Eyes: Conjunctivae and EOM are normal. Pupils are equal, round, and reactive to light.  Neck: Neck supple. No thyromegaly present.  Cardiovascular: Normal rate, regular rhythm and normal heart sounds.   Pulmonary/Chest: Effort normal and breath sounds normal. She has no wheezes.  Lymphadenopathy:    She has no cervical adenopathy.  Neurological: She is alert and oriented to person, place, and time.  Skin: Skin is warm and dry.  Psychiatric: She has a normal mood and affect.       Assessment & Plan:  FAtigue- seems like it was transient and she is feelng much better.  sxs seemed to have resolved. Based on timing I think it is unlikely to be the flu vaccine she received.  With hx of iron def anemia will recheck levels.    HTN - Well controlled. Continue current regimen. Follow up in  6 mo.   Obesity/BMI 37  - she  Has had an almost 20 lb  drop in weight while sick. She is interested in the possiblility of bariatric surgery.  Will refer.

## 2016-04-16 LAB — IRON AND TIBC
%SAT: 8 % — AB (ref 11–50)
IRON: 32 ug/dL — AB (ref 45–160)
TIBC: 394 ug/dL (ref 250–450)
UIBC: 362 ug/dL (ref 125–400)

## 2016-05-19 ENCOUNTER — Other Ambulatory Visit: Payer: Self-pay | Admitting: Family Medicine

## 2016-07-10 ENCOUNTER — Other Ambulatory Visit: Payer: Self-pay | Admitting: Family Medicine

## 2016-08-19 ENCOUNTER — Ambulatory Visit (INDEPENDENT_AMBULATORY_CARE_PROVIDER_SITE_OTHER): Payer: Medicare HMO | Admitting: Family Medicine

## 2016-08-19 VITALS — BP 118/50 | HR 63 | Wt 260.0 lb

## 2016-08-19 DIAGNOSIS — M1611 Unilateral primary osteoarthritis, right hip: Secondary | ICD-10-CM | POA: Diagnosis not present

## 2016-08-19 DIAGNOSIS — E611 Iron deficiency: Secondary | ICD-10-CM | POA: Diagnosis not present

## 2016-08-19 DIAGNOSIS — R6889 Other general symptoms and signs: Secondary | ICD-10-CM

## 2016-08-19 DIAGNOSIS — I10 Essential (primary) hypertension: Secondary | ICD-10-CM | POA: Diagnosis not present

## 2016-08-19 DIAGNOSIS — M7061 Trochanteric bursitis, right hip: Secondary | ICD-10-CM

## 2016-08-19 DIAGNOSIS — M25562 Pain in left knee: Secondary | ICD-10-CM | POA: Insufficient documentation

## 2016-08-19 DIAGNOSIS — F331 Major depressive disorder, recurrent, moderate: Secondary | ICD-10-CM | POA: Diagnosis not present

## 2016-08-19 NOTE — Progress Notes (Signed)
Terri Farrell is a 66 y.o. female who presents [facility] today for left knee pain and right.  Left knee pain: Patient has had ongoing left knee pain for years. She has a history of DJD and has had a right total knee replacement in 2011.  A few years ago she had a left knee steroid injection which lasted until recently. She notes worsening pain with ambulation and notes some swelling. She denies significant locking or catching. She's tried some over-the-counter medicines which are only helped a little.  Additionally patient has right lateral hip pain. She's been diagnosed with end-stage DJD of her right hip. She points to the lateral aspect of her hip near the insertion of the gluteus medius tendon onto the greater trochanter as the location of her pain. She notes that she occasionally has some groin pain but most of the time is lateral. She's been using a cane to ambulate and notes that she walks with a limp. She notes the pain is quite severe at times and especially bothersome when she lies on that right side.   Past Medical History:  Diagnosis Date  . Arthritis    RA  . Depression    Past Surgical History:  Procedure Laterality Date  . ABDOMINAL HYSTERECTOMY  1996   for fibroids  . CARPAL TUNNEL RELEASE     right and left  . CESAREAN SECTION     X 2  . TOTAL KNEE ARTHROPLASTY  08/2010   Social History  Substance Use Topics  . Smoking status: Former Smoker    Packs/day: 1.00    Years: 2.00    Types: Cigarettes  . Smokeless tobacco: Not on file  . Alcohol use No     ROS:  As above   Medications: Current Outpatient Prescriptions  Medication Sig Dispense Refill  . fluticasone (FLONASE) 50 MCG/ACT nasal spray PLACE 2 SPRAYS INTO BOTH NOSTRILS DAILY. 16 g 3  . losartan-hydrochlorothiazide (HYZAAR) 50-12.5 MG tablet TAKE 1 TABLET BY MOUTH DAILY. 90 tablet 0   No current facility-administered medications for this visit.     Allergies  Allergen Reactions  . Cymbalta [Duloxetine Hcl] Other (See Comments)    Sedation, inc hunger  . Lisinopril Other (See Comments)    Fatigue   . Sertraline Other (See Comments)    hallucinations     Exam:   BP  118/50   Pulse 63   Wt 260 lb (117.9 kg)   BMI 44.63 kg/m   BSA 2.31 m  General: Well Developed, well nourished, and in no acute distress. Morbid obesity Neuro/Psych: Alert and oriented x3, extra-ocular muscles intact, able to move all 4 extremities, sensation grossly intact. Skin: Warm and dry, no rashes noted.  Respiratory: Not using accessory muscles, speaking in full sentences, trachea midline.  Cardiovascular: Pulses palpable, no extremity edema. Abdomen: Does not appear distended. MSK:  Right hip: Decreased internal/external rotation range of motion and decreased flexion. Mild pain with range of motion testing. Very tender at the greater trochanter especially near the superior lateral margin of the insertion of the gluteus medius and minimus tendons. She is extremely weak hip abduction 4/5.  Left knee: Obese difficult to appreciate effusion. Nontender. Range of motion 0-100 with 2+ retropatellar crepitations. Stable ligamentous exam.  Patient ambulates using a cane. She has a significant antalgic type. Trendelenburg gait  Procedure: Real-time Ultrasound Guided Injection of left knee  Device: GE Logiq E  Images permanently stored and available  for review in the ultrasound unit. Verbal informed consent obtained. Discussed risks and benefits of procedure. Warned about infection bleeding damage to structures skin hypopigmentation and fat atrophy among others. Patient expresses understanding and agreement Time-out conducted.  Noted no overlying erythema, induration, or other signs of local infection.  Skin prepped in a sterile fashion.  Local anesthesia: Topical Ethyl chloride.  With sterile technique and under real time ultrasound guidance:  80 mg of Kenalog and 4 mL of Marcaine injected easily.  Spinal needle used due to body habitus Completed without difficulty  Pain immediately resolved suggesting accurate placement of the medication.  Advised to call if fevers/chills, erythema, induration, drainage, or persistent bleeding.  Images permanently stored and available for review in the ultrasound unit.  Impression: Technically successful ultrasound guided injection.   Ultrasound-guided's needed due to body habitus.   Study Result   *RADIOLOGY REPORT*  Clinical Data: Right hip pain for 6 months.  RIGHT HIP - COMPLETE 2+ VIEW  Comparison: None.  Findings: There is collapse and sclerosis of the right femoral head. No visible joint spacing is evident.  Healed post-traumatic changes are present as at the pubic symphysis.  Much more mild degenerative changes present in the left hip.  Degenerative changes are noted in the lower lumbar spine.  IMPRESSION:  1.  Sclerosis and collapse of the right femoral head with obliteration of the joint space. 2.  Much more mild degenerative changes of the left hip and lower lumbar spine.   Original Report Authenticated By: Terri Farrell, M.D.     No results found for this or any previous visit (from the past 48 hour(s)). No results found.    Assessment and Plan: 66 y.o. female with  Left knee pain due to DJD. Patient is putting quite a bit a weight on her left knee to offload her right leg. Injected today. Plan for watchful waiting.  Terri Farrell has quite a bit of right lateral hip pain. I think this is probably insertional tendinopathy of the gluteus medius or minimus tendons. This is consistent with greater trochanter pain syndrome formally greater trochanteric bursitis. She also has quite a degree of DJD in the right hip although I don't think that the primary pain generator at this time. I think she do very well with a trial of physical therapy. Her main barriers  her transportation. We'll arrange for community care insertion to help arrange for transportation as well as outpatient physical therapy. If unable to get here with transportation will use home health physical therapy.  Recheck in one month.  Orders Placed This Encounter  Procedures  . Ambulatory referral to [facility]    Referral Priority:   Routine    Referral Type:   Consultation    Referral Reason:   Specialty Services Required    Number of Visits Requested:   1    Discussed warning signs or symptoms. Please see discharge instructions. Patient expresses understanding.  CC: METHENEY,CATHERINE, MD

## 2016-08-19 NOTE — Progress Notes (Signed)
Subjective:    CC: HTN  HPI: Hypertension- Pt denies chest pain, SOB, dizziness, or heart palpitations.  Taking meds as directed w/o problems.  Denies medication side effects.    MDD - Restarted her Cymbalta back in July.Actually decided to come off of it a couple months ago because she was feeling a little better. She's not currently on any type of antidepressant.  She did end up stopping her Goody powders. She felt like maybe it was causing some type symptoms including fatigue and runny nose. She wonders if she could be allergic to aspirin. She says since stopping the goody powders she's actually felt a whole lot better.  Iron deficiency anemia-she has been taking one iron tablet day. She still feels cold all the time.  OA right hip and left knee-now that she's no longer taking the goody powders for pain she's been trying to find something that helpful. She did switch to the Tylenol arthritis and it helps some. She's wondering if she might benefit from a shot in her left knee. She had one done years ago and it actually lasted about 2-3 years before or off.  Past medical history, Surgical history, Family history not pertinant except as noted below, Social history, Allergies, and medications have been entered into the medical record, reviewed, and corrections made.   Review of Systems: No fevers, chills, night sweats, weight loss, chest pain, or shortness of breath.   Objective:    General: Well Developed, well nourished, and in no acute distress.  Neuro: Alert and oriented x3, extra-ocular muscles intact, sensation grossly intact.  HEENT: Normocephalic, atraumatic, Oropharynx is clear, TMs and canals are clear bilaterally. No significant cervical lymphadenopathy.  Skin: Warm and dry, no rashes. Cardiac: Regular rate and rhythm, no murmurs rubs or gallops, no lower extremity edema.  Respiratory: Clear to auscultation bilaterally. Not using accessory muscles, speaking in full  sentences.   Impression and Recommendations:    HTN  - Well controlled. Continue current regimen. Follow up in  6 months.   MDD - PHQ 9 score of 13 but patient declines to take medication or additional treatment.  Iron deficiency anemia-due to recheck ferritin and iron levels.  Osteoarthritis-will refer for injection for her knee. I do think she could have some significant improvement in her pain at least improve rarely until she is able to lose more weight to be a candidate for hip replacement and possibly knee replacement. Also consider Voltaren gel.  Cold intolerance-likely from her iron deficiency anemia but we'll recheck thyroid.

## 2016-08-20 ENCOUNTER — Other Ambulatory Visit: Payer: Self-pay | Admitting: *Deleted

## 2016-08-20 LAB — CBC
HEMATOCRIT: 40.4 % (ref 35.0–45.0)
Hemoglobin: 13 g/dL (ref 11.7–15.5)
MCH: 30.2 pg (ref 27.0–33.0)
MCHC: 32.2 g/dL (ref 32.0–36.0)
MCV: 93.7 fL (ref 80.0–100.0)
MPV: 13.1 fL — AB (ref 7.5–12.5)
Platelets: 144 10*3/uL (ref 140–400)
RBC: 4.31 MIL/uL (ref 3.80–5.10)
RDW: 13.8 % (ref 11.0–15.0)
WBC: 6.2 10*3/uL (ref 3.8–10.8)

## 2016-08-20 LAB — BASIC METABOLIC PANEL WITH GFR
BUN: 17 mg/dL (ref 7–25)
CO2: 28 mmol/L (ref 20–31)
CREATININE: 0.91 mg/dL (ref 0.50–0.99)
Calcium: 9.7 mg/dL (ref 8.6–10.4)
Chloride: 101 mmol/L (ref 98–110)
GFR, Est African American: 78 mL/min (ref >=60)
GFR, Est Non African American: 67 mL/min (ref >=60)
GLUCOSE: 79 mg/dL (ref 65–99)
POTASSIUM: 4.3 mmol/L (ref 3.5–5.3)
Sodium: 140 mmol/L (ref 135–146)

## 2016-08-20 LAB — FERRITIN: FERRITIN: 43 ng/mL (ref 20–288)

## 2016-08-20 LAB — IRON: Iron: 71 ug/dL (ref 45–160)

## 2016-08-20 LAB — TSH: TSH: 1.56 mIU/L

## 2016-08-20 MED ORDER — DICLOFENAC SODIUM 1.5 % TD SOLN: TRANSDERMAL | 4 refills | Status: DC

## 2016-08-20 MED ORDER — FLUTICASONE PROPIONATE 50 MCG/ACT NA SUSP
2.0000 | Freq: Every day | NASAL | 3 refills | Status: DC
Start: 2016-08-20 — End: 2017-01-27

## 2016-08-24 ENCOUNTER — Other Ambulatory Visit: Payer: Self-pay | Admitting: *Deleted

## 2016-08-24 MED ORDER — DICLOFENAC SODIUM 1 % TD GEL: 4.0000 g | Freq: Four times a day (QID) | TRANSDERMAL | 4 refills | Status: DC

## 2016-09-16 ENCOUNTER — Ambulatory Visit: Payer: Medicare HMO

## 2016-09-16 ENCOUNTER — Encounter: Payer: Self-pay | Admitting: Family Medicine

## 2016-09-16 ENCOUNTER — Ambulatory Visit (INDEPENDENT_AMBULATORY_CARE_PROVIDER_SITE_OTHER): Payer: Medicare HMO | Admitting: Family Medicine

## 2016-09-16 VITALS — BP 136/54 | HR 59

## 2016-09-16 DIAGNOSIS — M7061 Trochanteric bursitis, right hip: Secondary | ICD-10-CM | POA: Diagnosis not present

## 2016-09-16 NOTE — Progress Notes (Signed)
Terri Farrell is a 66 y.o. female who presents to [facility] today for follow-up of knee and hip pain. Patient was seen last week for left knee pain and right lateral hip pain. She has severe end-stage right hip DJD. The hip pain that she was complaining of was more lateral. At the last visit I injected the left knee and tried to arrange for outpatient physical therapy of the right lateral hip pain that I thought was due to greater trochanter regional pain syndrome.  She notes that the knee pain has improved but she was unable to attend physical therapy due to transportation difficulties. She continues to express right lateral hip pain and would like to do home physical therapy if possible.   Past Medical History:  Diagnosis Date  . Arthritis    RA  . Depression    Past Surgical History:  Procedure Laterality Date  . ABDOMINAL HYSTERECTOMY  1996   for fibroids  . CARPAL TUNNEL RELEASE     right and left  . CESAREAN SECTION     X 2  . TOTAL KNEE ARTHROPLASTY  08/2010   Social History  Substance Use Topics  . Smoking status: Former Smoker    Packs/day: 1.00    Years: 2.00    Types: Cigarettes  . Smokeless tobacco: Never Used  . Alcohol use No     ROS:  As above   Medications: Current Outpatient Prescriptions  Medication Sig Dispense Refill  . diclofenac sodium (VOLTAREN) 1 % GEL Apply 4 g topically 4 (four) times daily. To knee joints 3 times a day as needed 100 g 4  . fluticasone (FLONASE) 50 MCG/ACT nasal spray Place 2 sprays into both nostrils daily. 16 g 3  . losartan-hydrochlorothiazide (HYZAAR) 50-12.5 MG tablet TAKE 1 TABLET BY MOUTH DAILY. 90 tablet 0   No current facility-administered medications for this visit.    Allergies  Allergen Reactions  . Cymbalta [Duloxetine Hcl] Other (See Comments)    Sedation, inc hunger  . Lisinopril Other (See Comments)    Fatigue   . Sertraline Other (See Comments)   hallucinations     Exam:  BP (!) 136/54   Pulse (!) 59  General: Well Developed, well nourished, and in no acute distress.  Neuro/Psych: Alert and oriented x3, extra-ocular muscles intact, able to move all 4 extremities, sensation grossly intact. Skin: Warm and dry, no rashes noted.  Respiratory: Not using accessory muscles, speaking in full sentences, trachea midline.  Cardiovascular: Pulses palpable, no extremity edema. Abdomen: Does not appear distended. MSK: Right hip significantly limited motion. Tender palpation right lateral hip. Patient walks with an antalgic gait with a cane in her left hand.    No results found for this or any previous visit (from the past 48 hour(s)). No results found.    Assessment and Plan: 66 y.o. female with right lateral hip pain due to DJD and probable greater trochanter pain syndrome. Plan to refer to home physical therapy and recheck in 1-2 months. Still significant symptomatic will consider injection     Orders Placed This Encounter  Procedures  . Ambulatory referral to Home Health    Referral Priority:   Routine    Referral Type:   Home Health Care    Referral Reason:   Specialty Services Required    Requested Specialty:   Home Health Services    Number of Visits Requested:   1    Discussed warning signs or  symptoms. Please see discharge instructions. Patient expresses understanding.

## 2016-09-16 NOTE — Patient Instructions (Signed)
Thank you for coming in today. Do home PT for hip.  Recheck in 1-2 months.  If not better we will consider a shot.    Bursitis Bursitis is when the fluid-filled sac (bursa) that covers and protects a joint is swollen (inflamed). Bursitis is most common near joints, especially the knees, elbows, hips, and shoulders. Follow these instructions at home:  Take medicines only as told by your doctor.  If you were prescribed an antibiotic medicine, finish it all even if you start to feel better.  Rest the affected area as told by your doctor.  Keep the area raised up.  Avoid doing things that make the pain worse.  Apply ice to the injured area:  Place ice in a plastic bag.  Place a towel between your skin and the bag.  Leave the ice on for 20 minutes, 2-3 times a day.  Use splints, braces, pads, or walking aids as told by your doctor.  Keep all follow-up visits as told by your doctor. This is important. Contact a doctor if:  You have more pain with home care.  You have a fever.  You have chills. This information is not intended to replace advice given to you by your health care provider. Make sure you discuss any questions you have with your health care provider. Document Released: 11/19/2009 Document Revised: 11/07/2015 Document Reviewed: 08/21/2013 Elsevier Interactive Patient Education  2017 [employer].

## 2016-09-18 DIAGNOSIS — Z9181 History of falling: Secondary | ICD-10-CM | POA: Diagnosis not present

## 2016-09-18 DIAGNOSIS — M6281 Muscle weakness (generalized): Secondary | ICD-10-CM | POA: Diagnosis not present

## 2016-09-18 DIAGNOSIS — I10 Essential (primary) hypertension: Secondary | ICD-10-CM | POA: Diagnosis not present

## 2016-09-18 DIAGNOSIS — K219 Gastro-esophageal reflux disease without esophagitis: Secondary | ICD-10-CM | POA: Diagnosis not present

## 2016-09-18 DIAGNOSIS — M7061 Trochanteric bursitis, right hip: Secondary | ICD-10-CM | POA: Diagnosis not present

## 2016-09-18 DIAGNOSIS — F329 Major depressive disorder, single episode, unspecified: Secondary | ICD-10-CM | POA: Diagnosis not present

## 2016-09-18 DIAGNOSIS — M159 Polyosteoarthritis, unspecified: Secondary | ICD-10-CM | POA: Diagnosis not present

## 2016-09-21 ENCOUNTER — Telehealth: Payer: Self-pay

## 2016-09-21 NOTE — Telephone Encounter (Signed)
Ok for order?  

## 2016-09-21 NOTE — Telephone Encounter (Signed)
[facility] called and needs a verbal order for PT treatment at home.  Please advise.

## 2016-09-22 NOTE — Telephone Encounter (Signed)
Left message on Terri Farrell's phone for PT Treatment.

## 2016-09-25 DIAGNOSIS — Z9181 History of falling: Secondary | ICD-10-CM | POA: Diagnosis not present

## 2016-09-25 DIAGNOSIS — F329 Major depressive disorder, single episode, unspecified: Secondary | ICD-10-CM | POA: Diagnosis not present

## 2016-09-25 DIAGNOSIS — M6281 Muscle weakness (generalized): Secondary | ICD-10-CM | POA: Diagnosis not present

## 2016-09-25 DIAGNOSIS — M7061 Trochanteric bursitis, right hip: Secondary | ICD-10-CM | POA: Diagnosis not present

## 2016-09-25 DIAGNOSIS — K219 Gastro-esophageal reflux disease without esophagitis: Secondary | ICD-10-CM | POA: Diagnosis not present

## 2016-09-25 DIAGNOSIS — M159 Polyosteoarthritis, unspecified: Secondary | ICD-10-CM | POA: Diagnosis not present

## 2016-09-25 DIAGNOSIS — I10 Essential (primary) hypertension: Secondary | ICD-10-CM | POA: Diagnosis not present

## 2016-09-26 DIAGNOSIS — I10 Essential (primary) hypertension: Secondary | ICD-10-CM | POA: Diagnosis not present

## 2016-09-26 DIAGNOSIS — F329 Major depressive disorder, single episode, unspecified: Secondary | ICD-10-CM | POA: Diagnosis not present

## 2016-09-26 DIAGNOSIS — M159 Polyosteoarthritis, unspecified: Secondary | ICD-10-CM | POA: Diagnosis not present

## 2016-09-26 DIAGNOSIS — Z9181 History of falling: Secondary | ICD-10-CM | POA: Diagnosis not present

## 2016-09-26 DIAGNOSIS — M7061 Trochanteric bursitis, right hip: Secondary | ICD-10-CM | POA: Diagnosis not present

## 2016-09-26 DIAGNOSIS — K219 Gastro-esophageal reflux disease without esophagitis: Secondary | ICD-10-CM | POA: Diagnosis not present

## 2016-09-26 DIAGNOSIS — M6281 Muscle weakness (generalized): Secondary | ICD-10-CM | POA: Diagnosis not present

## 2016-09-29 DIAGNOSIS — M6281 Muscle weakness (generalized): Secondary | ICD-10-CM | POA: Diagnosis not present

## 2016-09-29 DIAGNOSIS — I10 Essential (primary) hypertension: Secondary | ICD-10-CM | POA: Diagnosis not present

## 2016-09-29 DIAGNOSIS — F329 Major depressive disorder, single episode, unspecified: Secondary | ICD-10-CM | POA: Diagnosis not present

## 2016-09-29 DIAGNOSIS — K219 Gastro-esophageal reflux disease without esophagitis: Secondary | ICD-10-CM | POA: Diagnosis not present

## 2016-09-29 DIAGNOSIS — M7061 Trochanteric bursitis, right hip: Secondary | ICD-10-CM | POA: Diagnosis not present

## 2016-09-29 DIAGNOSIS — Z9181 History of falling: Secondary | ICD-10-CM | POA: Diagnosis not present

## 2016-09-29 DIAGNOSIS — M159 Polyosteoarthritis, unspecified: Secondary | ICD-10-CM | POA: Diagnosis not present

## 2016-10-01 DIAGNOSIS — K219 Gastro-esophageal reflux disease without esophagitis: Secondary | ICD-10-CM | POA: Diagnosis not present

## 2016-10-01 DIAGNOSIS — I10 Essential (primary) hypertension: Secondary | ICD-10-CM | POA: Diagnosis not present

## 2016-10-01 DIAGNOSIS — F329 Major depressive disorder, single episode, unspecified: Secondary | ICD-10-CM | POA: Diagnosis not present

## 2016-10-01 DIAGNOSIS — Z9181 History of falling: Secondary | ICD-10-CM | POA: Diagnosis not present

## 2016-10-01 DIAGNOSIS — M159 Polyosteoarthritis, unspecified: Secondary | ICD-10-CM | POA: Diagnosis not present

## 2016-10-01 DIAGNOSIS — M7061 Trochanteric bursitis, right hip: Secondary | ICD-10-CM | POA: Diagnosis not present

## 2016-10-01 DIAGNOSIS — M6281 Muscle weakness (generalized): Secondary | ICD-10-CM | POA: Diagnosis not present

## 2016-10-07 DIAGNOSIS — I10 Essential (primary) hypertension: Secondary | ICD-10-CM | POA: Diagnosis not present

## 2016-10-07 DIAGNOSIS — M6281 Muscle weakness (generalized): Secondary | ICD-10-CM | POA: Diagnosis not present

## 2016-10-07 DIAGNOSIS — Z9181 History of falling: Secondary | ICD-10-CM | POA: Diagnosis not present

## 2016-10-07 DIAGNOSIS — M7061 Trochanteric bursitis, right hip: Secondary | ICD-10-CM | POA: Diagnosis not present

## 2016-10-07 DIAGNOSIS — M159 Polyosteoarthritis, unspecified: Secondary | ICD-10-CM | POA: Diagnosis not present

## 2016-10-07 DIAGNOSIS — K219 Gastro-esophageal reflux disease without esophagitis: Secondary | ICD-10-CM | POA: Diagnosis not present

## 2016-10-07 DIAGNOSIS — F329 Major depressive disorder, single episode, unspecified: Secondary | ICD-10-CM | POA: Diagnosis not present

## 2016-10-12 ENCOUNTER — Other Ambulatory Visit: Payer: Self-pay | Admitting: Family Medicine

## 2016-10-19 ENCOUNTER — Telehealth: Payer: Self-pay | Admitting: Family Medicine

## 2016-10-19 DIAGNOSIS — M159 Polyosteoarthritis, unspecified: Secondary | ICD-10-CM

## 2016-10-19 DIAGNOSIS — M6281 Muscle weakness (generalized): Secondary | ICD-10-CM

## 2016-10-19 MED ORDER — AMBULATORY NON FORMULARY MEDICATION: 0 refills | Status: DC

## 2016-10-19 NOTE — Telephone Encounter (Signed)
HH verbal order given for PT 2x week for 2 weeks.  Also requested Rx for a 4 wheel walker to be faxed to [phone]. Will place order.

## 2016-10-20 DIAGNOSIS — M6281 Muscle weakness (generalized): Secondary | ICD-10-CM | POA: Diagnosis not present

## 2016-10-20 DIAGNOSIS — M7061 Trochanteric bursitis, right hip: Secondary | ICD-10-CM | POA: Diagnosis not present

## 2016-10-20 DIAGNOSIS — M159 Polyosteoarthritis, unspecified: Secondary | ICD-10-CM | POA: Diagnosis not present

## 2016-10-20 DIAGNOSIS — Z9181 History of falling: Secondary | ICD-10-CM | POA: Diagnosis not present

## 2016-10-20 DIAGNOSIS — I10 Essential (primary) hypertension: Secondary | ICD-10-CM | POA: Diagnosis not present

## 2016-10-20 DIAGNOSIS — K219 Gastro-esophageal reflux disease without esophagitis: Secondary | ICD-10-CM | POA: Diagnosis not present

## 2016-10-20 DIAGNOSIS — F329 Major depressive disorder, single episode, unspecified: Secondary | ICD-10-CM | POA: Diagnosis not present

## 2016-10-20 NOTE — Telephone Encounter (Signed)
Agree with above. Thank you

## 2016-10-22 DIAGNOSIS — K219 Gastro-esophageal reflux disease without esophagitis: Secondary | ICD-10-CM | POA: Diagnosis not present

## 2016-10-22 DIAGNOSIS — Z9181 History of falling: Secondary | ICD-10-CM | POA: Diagnosis not present

## 2016-10-22 DIAGNOSIS — I10 Essential (primary) hypertension: Secondary | ICD-10-CM | POA: Diagnosis not present

## 2016-10-22 DIAGNOSIS — M159 Polyosteoarthritis, unspecified: Secondary | ICD-10-CM | POA: Diagnosis not present

## 2016-10-22 DIAGNOSIS — F329 Major depressive disorder, single episode, unspecified: Secondary | ICD-10-CM | POA: Diagnosis not present

## 2016-10-22 DIAGNOSIS — M7061 Trochanteric bursitis, right hip: Secondary | ICD-10-CM | POA: Diagnosis not present

## 2016-10-22 DIAGNOSIS — M6281 Muscle weakness (generalized): Secondary | ICD-10-CM | POA: Diagnosis not present

## 2016-10-26 DIAGNOSIS — M7061 Trochanteric bursitis, right hip: Secondary | ICD-10-CM | POA: Diagnosis not present

## 2016-10-26 DIAGNOSIS — K219 Gastro-esophageal reflux disease without esophagitis: Secondary | ICD-10-CM | POA: Diagnosis not present

## 2016-10-26 DIAGNOSIS — Z9181 History of falling: Secondary | ICD-10-CM | POA: Diagnosis not present

## 2016-10-26 DIAGNOSIS — I10 Essential (primary) hypertension: Secondary | ICD-10-CM | POA: Diagnosis not present

## 2016-10-26 DIAGNOSIS — M159 Polyosteoarthritis, unspecified: Secondary | ICD-10-CM | POA: Diagnosis not present

## 2016-10-26 DIAGNOSIS — M6281 Muscle weakness (generalized): Secondary | ICD-10-CM | POA: Diagnosis not present

## 2016-10-26 DIAGNOSIS — F329 Major depressive disorder, single episode, unspecified: Secondary | ICD-10-CM | POA: Diagnosis not present

## 2016-10-28 DIAGNOSIS — M7061 Trochanteric bursitis, right hip: Secondary | ICD-10-CM | POA: Diagnosis not present

## 2016-10-28 DIAGNOSIS — F329 Major depressive disorder, single episode, unspecified: Secondary | ICD-10-CM | POA: Diagnosis not present

## 2016-10-28 DIAGNOSIS — M159 Polyosteoarthritis, unspecified: Secondary | ICD-10-CM | POA: Diagnosis not present

## 2016-10-28 DIAGNOSIS — K219 Gastro-esophageal reflux disease without esophagitis: Secondary | ICD-10-CM | POA: Diagnosis not present

## 2016-10-28 DIAGNOSIS — Z9181 History of falling: Secondary | ICD-10-CM | POA: Diagnosis not present

## 2016-10-28 DIAGNOSIS — M6281 Muscle weakness (generalized): Secondary | ICD-10-CM | POA: Diagnosis not present

## 2016-10-28 DIAGNOSIS — I10 Essential (primary) hypertension: Secondary | ICD-10-CM | POA: Diagnosis not present

## 2016-11-04 DIAGNOSIS — Z888 Allergy status to other drugs, medicaments and biological substances status: Secondary | ICD-10-CM | POA: Diagnosis not present

## 2016-11-04 DIAGNOSIS — M25512 Pain in left shoulder: Secondary | ICD-10-CM | POA: Diagnosis not present

## 2016-11-04 DIAGNOSIS — Z79899 Other long term (current) drug therapy: Secondary | ICD-10-CM | POA: Diagnosis not present

## 2016-11-04 DIAGNOSIS — M542 Cervicalgia: Secondary | ICD-10-CM | POA: Diagnosis not present

## 2016-11-04 DIAGNOSIS — Z6841 Body Mass Index (BMI) 40.0 and over, adult: Secondary | ICD-10-CM | POA: Diagnosis not present

## 2016-11-04 DIAGNOSIS — R079 Chest pain, unspecified: Secondary | ICD-10-CM | POA: Diagnosis not present

## 2016-11-04 DIAGNOSIS — M25511 Pain in right shoulder: Secondary | ICD-10-CM | POA: Diagnosis not present

## 2016-11-04 DIAGNOSIS — R110 Nausea: Secondary | ICD-10-CM | POA: Diagnosis not present

## 2016-11-04 DIAGNOSIS — Z886 Allergy status to analgesic agent status: Secondary | ICD-10-CM | POA: Diagnosis not present

## 2016-11-04 DIAGNOSIS — E669 Obesity, unspecified: Secondary | ICD-10-CM | POA: Diagnosis not present

## 2016-11-04 DIAGNOSIS — I10 Essential (primary) hypertension: Secondary | ICD-10-CM | POA: Diagnosis not present

## 2016-11-04 DIAGNOSIS — M1990 Unspecified osteoarthritis, unspecified site: Secondary | ICD-10-CM | POA: Diagnosis not present

## 2017-01-07 ENCOUNTER — Other Ambulatory Visit: Payer: Self-pay | Admitting: Family Medicine

## 2017-01-27 ENCOUNTER — Encounter: Payer: Self-pay | Admitting: Family Medicine

## 2017-01-27 ENCOUNTER — Other Ambulatory Visit: Payer: Self-pay | Admitting: Family Medicine

## 2017-01-27 ENCOUNTER — Ambulatory Visit (INDEPENDENT_AMBULATORY_CARE_PROVIDER_SITE_OTHER): Payer: Medicare HMO | Admitting: Family Medicine

## 2017-01-27 VITALS — BP 124/55 | HR 72 | Wt 276.0 lb

## 2017-01-27 DIAGNOSIS — R6889 Other general symptoms and signs: Secondary | ICD-10-CM | POA: Diagnosis not present

## 2017-01-27 DIAGNOSIS — D508 Other iron deficiency anemias: Secondary | ICD-10-CM

## 2017-01-27 DIAGNOSIS — I10 Essential (primary) hypertension: Secondary | ICD-10-CM

## 2017-01-27 DIAGNOSIS — F331 Major depressive disorder, recurrent, moderate: Secondary | ICD-10-CM

## 2017-01-27 DIAGNOSIS — M7989 Other specified soft tissue disorders: Secondary | ICD-10-CM

## 2017-01-27 DIAGNOSIS — M25512 Pain in left shoulder: Secondary | ICD-10-CM | POA: Diagnosis not present

## 2017-01-27 DIAGNOSIS — M6281 Muscle weakness (generalized): Secondary | ICD-10-CM

## 2017-01-27 DIAGNOSIS — M159 Polyosteoarthritis, unspecified: Secondary | ICD-10-CM

## 2017-01-27 LAB — CBC WITH DIFFERENTIAL/PLATELET
BASOS PCT: 1 %
Basophils Absolute: 64 cells/uL (ref 0–200)
EOS ABS: 64 {cells}/uL (ref 15–500)
Eosinophils Relative: 1 %
HEMATOCRIT: 41.9 % (ref 35.0–45.0)
HEMOGLOBIN: 14.2 g/dL (ref 11.7–15.5)
LYMPHS ABS: 1280 {cells}/uL (ref 850–3900)
Lymphocytes Relative: 20 %
MCH: 31.3 pg (ref 27.0–33.0)
MCHC: 33.9 g/dL (ref 32.0–36.0)
MCV: 92.5 fL (ref 80.0–100.0)
MONO ABS: 384 {cells}/uL (ref 200–950)
MPV: 11.2 fL (ref 7.5–12.5)
Monocytes Relative: 6 %
NEUTROS ABS: 4608 {cells}/uL (ref 1500–7800)
Neutrophils Relative %: 72 %
Platelets: 196 10*3/uL (ref 140–400)
RBC: 4.53 MIL/uL (ref 3.80–5.10)
RDW: 13.1 % (ref 11.0–15.0)
WBC: 6.4 10*3/uL (ref 3.8–10.8)

## 2017-01-27 LAB — TSH: TSH: 2.29 m[IU]/L

## 2017-01-27 MED ORDER — FLUTICASONE PROPIONATE 50 MCG/ACT NA SUSP: 2.0000 | Freq: Every day | NASAL | 3 refills | Status: DC

## 2017-01-27 MED ORDER — LOSARTAN POTASSIUM-HCTZ 50-12.5 MG PO TABS: 1.0000 | ORAL_TABLET | Freq: Every day | ORAL | 1 refills | Status: DC

## 2017-01-27 NOTE — Patient Instructions (Signed)
Zaditor eye drop for your eyes.   Call you eye doctor to make an appt.

## 2017-01-27 NOTE — Progress Notes (Signed)
Subjective:    CC: HTN  HPI:  Hypertension- Pt denies chest pain, SOB, dizziness, or heart palpitations.  Taking meds as directed w/o problems.  Denies medication side effects.  She does feel like her lower extremities swelling is worse. He is not drinking any swelling in the hands or abdomen. She gained over 16 pounds very quickly after her mother passed away. She said she just started eating a whole lot. She's actually back back down and is doing well and is actually lost a few pounds. She has been craving and eating salt foods.   About 2 weeks after her mother passed away she started experiencing neck and upper shoulder pain. She wanted to make sure it wasn't her heart so she did go to the emergency department. She is better but still having Some pain in her left shoulder.  MDD - She actually stopped her medication last March when I saw her. The time her PHQ 9 score was elevated at 13 but she did not want to restart medication.  F/U iron def - last check on her iron in March looked good. And encouraged her to continue her supplementation for about 3 more months and then discontinue at that point.  Past medical history, Surgical history, Family history not pertinant except as noted below, Social history, Allergies, and medications have been entered into the medical record, reviewed, and corrections made.   Review of Systems: No fevers, chills, night sweats, weight loss, chest pain, or shortness of breath.   Objective:    General: Well Developed, well nourished, and in no acute distress.  Neuro: Alert and oriented x3, extra-ocular muscles intact, sensation grossly intact.  HEENT: Normocephalic, atraumatic  Skin: Warm and dry, no rashes. Cardiac: Regular rate and rhythm, no murmurs rubs or gallops, no lower extremity edema.  Respiratory: Clear to auscultation bilaterally. Not using accessory muscles, speaking in full sentences.   Impression and Recommendations:    HTN - due CMP and  lipids.  MDD-Not doing well overall. PHQ 9 score of 16. She does have access to counseling through hospice and encouraged her to participate. We also discussed possibly restarting the medication but she wants to think about it. We could consider fluoxetine.  Iron def - back up to normal.   Lower extremities swelling-we'll check a BNP to rule out heart failure but I think it's likely just from increased salt intake and rapid weight gain. Continue tow ork on diet and cut out salt foods. I really didn't appreciate any significant edema such as pitting in the lower legs.  Left shoulder pain-did encourage her to get him back in with one of our sports medicine docs to take a look at it.

## 2017-01-27 NOTE — Telephone Encounter (Signed)
Terri ChurchesKelsi, can you please call the home health company. We tried to fax a prescription for a rolling walker back in May. Please see prescription. They say they did not receive it. Patient never received medical device.  Terri Gasseratherine Kamariyah Timberlake, MD

## 2017-01-28 LAB — LIPID PANEL W/REFLEX DIRECT LDL
CHOLESTEROL: 214 mg/dL — AB (ref <200)
HDL: 82 mg/dL (ref >50)
LDL-Cholesterol: 115 mg/dL — ABNORMAL HIGH
Non-HDL Cholesterol (Calc): 132 mg/dL — ABNORMAL HIGH (ref <130)
TRIGLYCERIDES: 73 mg/dL (ref <150)
Total CHOL/HDL Ratio: 2.6 Ratio (ref <5.0)

## 2017-01-28 LAB — COMPLETE METABOLIC PANEL WITH GFR
ALBUMIN: 4.4 g/dL (ref 3.6–5.1)
ALK PHOS: 54 U/L (ref 33–130)
ALT: 14 U/L (ref 6–29)
AST: 16 U/L (ref 10–35)
BILIRUBIN TOTAL: 0.4 mg/dL (ref 0.2–1.2)
BUN: 22 mg/dL (ref 7–25)
CALCIUM: 9.7 mg/dL (ref 8.6–10.4)
CO2: 19 mmol/L — ABNORMAL LOW (ref 20–32)
CREATININE: 0.77 mg/dL (ref 0.50–0.99)
Chloride: 102 mmol/L (ref 98–110)
GFR, EST NON AFRICAN AMERICAN: 82 mL/min (ref >=60)
Glucose, Bld: 101 mg/dL — ABNORMAL HIGH (ref 65–99)
Potassium: 4.2 mmol/L (ref 3.5–5.3)
Sodium: 138 mmol/L (ref 135–146)
TOTAL PROTEIN: 7.5 g/dL (ref 6.1–8.1)

## 2017-01-28 LAB — URINALYSIS
Bilirubin Urine: NEGATIVE
Glucose, UA: NEGATIVE
HGB URINE DIPSTICK: NEGATIVE
Ketones, ur: NEGATIVE
LEUKOCYTES UA: NEGATIVE
Nitrite: NEGATIVE
PH: 5 (ref 5.0–8.0)
Protein, ur: NEGATIVE
Specific Gravity, Urine: 1.024 (ref 1.001–1.035)

## 2017-01-28 LAB — BRAIN NATRIURETIC PEPTIDE: Brain Natriuretic Peptide: 25.6 pg/mL (ref <100)

## 2017-01-28 NOTE — Telephone Encounter (Signed)
Pt is no longer under care of [facility]. She was discharged in May 2018.

## 2017-01-28 NOTE — Telephone Encounter (Signed)
Okay, I think we should be able to print a prescription and she should be able to take it [facility] to [facility] and I do believe that they will process it with her insurance.

## 2017-01-29 MED ORDER — AMBULATORY NON FORMULARY MEDICATION: 0 refills | Status: DC

## 2017-01-29 NOTE — Telephone Encounter (Signed)
Pt advised of PCP recommendation, Rx printed. Pt will pick up when she can get a ride. No further questions.

## 2017-02-02 DIAGNOSIS — Z1211 Encounter for screening for malignant neoplasm of colon: Secondary | ICD-10-CM | POA: Diagnosis not present

## 2017-02-02 LAB — HM COLONOSCOPY

## 2017-02-17 ENCOUNTER — Encounter: Payer: Self-pay | Admitting: Family Medicine

## 2017-06-22 ENCOUNTER — Other Ambulatory Visit: Payer: Self-pay | Admitting: Family Medicine

## 2017-06-22 DIAGNOSIS — Z1239 Encounter for other screening for malignant neoplasm of breast: Secondary | ICD-10-CM

## 2017-07-01 ENCOUNTER — Ambulatory Visit (INDEPENDENT_AMBULATORY_CARE_PROVIDER_SITE_OTHER): Payer: Medicare HMO | Admitting: Family Medicine

## 2017-07-01 ENCOUNTER — Ambulatory Visit (INDEPENDENT_AMBULATORY_CARE_PROVIDER_SITE_OTHER): Payer: Medicare HMO

## 2017-07-01 ENCOUNTER — Encounter: Payer: Self-pay | Admitting: Family Medicine

## 2017-07-01 VITALS — BP 112/53 | HR 83 | Ht 64.0 in | Wt 295.0 lb

## 2017-07-01 DIAGNOSIS — Z1231 Encounter for screening mammogram for malignant neoplasm of breast: Secondary | ICD-10-CM

## 2017-07-01 DIAGNOSIS — R635 Abnormal weight gain: Secondary | ICD-10-CM | POA: Diagnosis not present

## 2017-07-01 DIAGNOSIS — R252 Cramp and spasm: Secondary | ICD-10-CM | POA: Diagnosis not present

## 2017-07-01 DIAGNOSIS — J0110 Acute frontal sinusitis, unspecified: Secondary | ICD-10-CM

## 2017-07-01 DIAGNOSIS — R7989 Other specified abnormal findings of blood chemistry: Secondary | ICD-10-CM | POA: Diagnosis not present

## 2017-07-01 DIAGNOSIS — I10 Essential (primary) hypertension: Secondary | ICD-10-CM

## 2017-07-01 DIAGNOSIS — F331 Major depressive disorder, recurrent, moderate: Secondary | ICD-10-CM | POA: Diagnosis not present

## 2017-07-01 DIAGNOSIS — Z1239 Encounter for other screening for malignant neoplasm of breast: Secondary | ICD-10-CM

## 2017-07-01 MED ORDER — AZITHROMYCIN 250 MG PO TABS: ORAL_TABLET | ORAL | 0 refills | Status: AC

## 2017-07-01 MED ORDER — BUPROPION HCL ER (XL) 150 MG PO TB24: 150.0000 mg | ORAL_TABLET | ORAL | 2 refills | Status: DC

## 2017-07-01 MED ORDER — LOSARTAN POTASSIUM-HCTZ 50-12.5 MG PO TABS: 1.0000 | ORAL_TABLET | Freq: Every day | ORAL | 1 refills | Status: DC

## 2017-07-01 NOTE — Progress Notes (Signed)
Subjective:    CC: BP ck  HPI:  Hypertension- Pt denies chest pain, SOB, dizziness, or heart palpitations.  Taking meds as directed w/o problems.  Denies medication side effects.    F/U grief/depression - she lost her mother over the summer. She had 3 more relative pass away  In DEcember.  She reports that she is still feeling unmotivated and little interest and pleasure doing things nearly every day.  She feels down more than half the days and complains of low energy overeating and feeling bad about herself.  She has not had any thoughts of wanting to harm herself.  Abnormal weight gain - she has had a hard time getting motivated and has been stress eating.  She did gain some weight since I last saw her.   She also has some nasal congestin, wheezing in her nose for about a week.  Throbbing HA in the morning.  Using her nasal saline.  NO fever, chills or sweats.    Past medical history, Surgical history, Family history not pertinant except as noted below, Social history, Allergies, and medications have been entered into the medical record, reviewed, and corrections made.   Review of Systems: No fevers, chills, night sweats, weight loss, chest pain, or shortness of breath.   Objective:    General: Well Developed, well nourished, and in no acute distress.  Neuro: Alert and oriented x3, extra-ocular muscles intact, sensation grossly intact.  HEENT: Normocephalic, atraumatic  Skin: Warm and dry, no rashes. Cardiac: Regular rate and rhythm, no murmurs rubs or gallops, no lower extremity edema.  Respiratory: Clear to auscultation bilaterally. Not using accessory muscles, speaking in full sentences.   Impression and Recommendations:    HTN  - Well controlled. Continue current regimen. Follow up in 6 months.    Abnormal weight gain -discussed getting back on track with healthy diet and regular exercise as best as she is able to.  Discussed possibly putting her on Wellbutrin to see if that  might help with her mood as well as help curb her appetite.  She is willing to try it at this point.  PHQ 9 score of 17.  Leg cramps -unclear etiology.  Will check for electrolyte abnormalities as well as magnesium.  Will call with results.  Just make sure hydrating well.  Major depressive disorder-PHQ 9 score of 17.  Because possibly trying Wellbutrin.  She is had an intolerance to Cymbalta and Zoloft.  I think this could help with her mood in addition to helping to curb her appetite. GAD 7 score of 11.    Acute sinusitis - will tx with zpack if not feeling some better in a couple of days.

## 2017-07-02 LAB — BASIC METABOLIC PANEL WITH GFR
BUN: 19 mg/dL (ref 7–25)
CO2: 25 mmol/L (ref 20–32)
CREATININE: 0.89 mg/dL (ref 0.50–0.99)
Calcium: 9.8 mg/dL (ref 8.6–10.4)
Chloride: 103 mmol/L (ref 98–110)
GFR, Est African American: 79 mL/min/{1.73_m2} (ref >=60)
GFR, Est Non African American: 68 mL/min/{1.73_m2} (ref >=60)
GLUCOSE: 109 mg/dL — AB (ref 65–99)
Potassium: 4.1 mmol/L (ref 3.5–5.3)
Sodium: 139 mmol/L (ref 135–146)

## 2017-07-02 LAB — MAGNESIUM: MAGNESIUM: 1.9 mg/dL (ref 1.5–2.5)

## 2017-07-02 LAB — CBC
HCT: 41.7 % (ref 35.0–45.0)
HEMOGLOBIN: 13.9 g/dL (ref 11.7–15.5)
MCH: 30.6 pg (ref 27.0–33.0)
MCHC: 33.3 g/dL (ref 32.0–36.0)
MCV: 91.9 fL (ref 80.0–100.0)
MPV: 12.1 fL (ref 7.5–12.5)
Platelets: 167 10*3/uL (ref 140–400)
RBC: 4.54 10*6/uL (ref 3.80–5.10)
RDW: 12 % (ref 11.0–15.0)
WBC: 7.9 10*3/uL (ref 3.8–10.8)

## 2017-07-02 LAB — FERRITIN: FERRITIN: 57 ng/mL (ref 20–288)

## 2017-08-31 ENCOUNTER — Ambulatory Visit (INDEPENDENT_AMBULATORY_CARE_PROVIDER_SITE_OTHER): Payer: Medicare HMO | Admitting: Family Medicine

## 2017-08-31 ENCOUNTER — Encounter: Payer: Self-pay | Admitting: Family Medicine

## 2017-08-31 VITALS — BP 127/62 | HR 68 | Ht 64.0 in | Wt 297.0 lb

## 2017-08-31 DIAGNOSIS — J301 Allergic rhinitis due to pollen: Secondary | ICD-10-CM | POA: Diagnosis not present

## 2017-08-31 DIAGNOSIS — Z6841 Body Mass Index (BMI) 40.0 and over, adult: Secondary | ICD-10-CM | POA: Diagnosis not present

## 2017-08-31 DIAGNOSIS — R635 Abnormal weight gain: Secondary | ICD-10-CM | POA: Diagnosis not present

## 2017-08-31 DIAGNOSIS — I10 Essential (primary) hypertension: Secondary | ICD-10-CM

## 2017-08-31 DIAGNOSIS — F331 Major depressive disorder, recurrent, moderate: Secondary | ICD-10-CM

## 2017-08-31 MED ORDER — OLMESARTAN MEDOXOMIL-HCTZ 20-12.5 MG PO TABS: 1.0000 | ORAL_TABLET | Freq: Every day | ORAL | 1 refills | Status: DC

## 2017-08-31 NOTE — Progress Notes (Signed)
Subjective:    CC: Depression  HPI:  Abnormal weight gain-when I saw her about 8 weeks ago we decided to put her on Wellbutrin just to see if that might help with her mood as well as help curb her appetite.  At that time her PHQ 9 score was 17 and she has a diagnosis of major depressive disorder. She said the medication made her cry but she slept better on it.  She said she felt more down and sad as well.  She took it for almost 2 months and then stopped it last Monday.  He is actually felt better since she is been off of it this past week.  Her depressive disorder-she is intolerant to Cymbalta and Zoloft and so we decided to try Wellbutrin.  The hope was that would also help curb her appetite.  PHQ 9 score was 17 and gad 7 score was 11.  Does report little pleasure interest in doing things several days of the week and feeling down more than half the days.   She says her allergies have also been flaring now that spring has started and the plans are blaming.  She is been using Flonase, 1 spray in each nostril but still getting a little bit of extra congestion and itching in the throat and ears.  Though it does help some.  Hypertension-she is heard about the recalls for losartan HCTZ on the news and is worried about this.  She has not received any phone call or letter from her insurance company or her pharmacy.  Past medical history, Surgical history, Family history not pertinant except as noted below, Social history, Allergies, and medications have been entered into the medical record, reviewed, and corrections made.   Review of Systems: No fevers, chills, night sweats, weight loss, chest pain, or shortness of breath.   Objective:    General: Well Developed, well nourished, and in no acute distress.  Neuro: Alert and oriented x3, extra-ocular muscles intact, sensation grossly intact.  HEENT: Normocephalic, atraumatic oropharynx is clear, TMs and canals are clear bilaterally. Skin: Warm and dry,  no rashes. Cardiac: Regular rate and rhythm, no murmurs rubs or gallops, no lower extremity edema.  Respiratory: Clear to auscultation bilaterally. Not using accessory muscles, speaking in full sentences.   Impression and Recommendations:   Abnormal weight gain/BMI 50 -plans on starting to walk again for exercise.  Now that she has a car that will make transportation a lot easier and she is hopeful she can get back to her regular routine.  Major depressive disorder -PHQ 9 score of 8 today.  Gust options.  She would really like to just stay off the Wellbutrin and not restart any medication at this time and just see how she does. Added med to intolerance list.   Allergic rhinitis-try increasing Flonase to 2 sprays in each nostril daily for 1-2 weeks for improved control of her symptoms and then if doing well at that point in time then okay to drop back down to 1 spray in each nostril daily.  Hypertension-even though her particular lot for losartan HCT [employer] has not been recalled she would like to switch medications.  I explained that it was a contaminant at particular factory that caused this and it did not affect all of the losartan HCTZ that is been manufacturer.  But she would just feel more comfortable so it is perfectly reasonable to go ahead and change her medication today.  Though I would like to have her  come back in about a month for nurse blood pressure check on the new medication just to make sure that it is effective.

## 2017-08-31 NOTE — Patient Instructions (Signed)
Increase her Flonase to 2 sprays in each nostril once daily for 1-2 weeks to see if this helps with your allergies.  After that if you are doing well then decreased back down to 1 spray in each nostril daily.

## 2017-11-18 ENCOUNTER — Other Ambulatory Visit: Payer: Self-pay | Admitting: Family Medicine

## 2018-01-11 ENCOUNTER — Encounter: Payer: Self-pay | Admitting: Osteopathic Medicine

## 2018-01-11 ENCOUNTER — Ambulatory Visit (INDEPENDENT_AMBULATORY_CARE_PROVIDER_SITE_OTHER): Payer: Medicare HMO | Admitting: Osteopathic Medicine

## 2018-01-11 ENCOUNTER — Ambulatory Visit (INDEPENDENT_AMBULATORY_CARE_PROVIDER_SITE_OTHER): Payer: Medicare HMO

## 2018-01-11 VITALS — BP 140/61 | HR 88 | Temp 98.3°F | Wt 296.7 lb

## 2018-01-11 DIAGNOSIS — R5382 Chronic fatigue, unspecified: Secondary | ICD-10-CM | POA: Diagnosis not present

## 2018-01-11 DIAGNOSIS — E559 Vitamin D deficiency, unspecified: Secondary | ICD-10-CM

## 2018-01-11 DIAGNOSIS — R0602 Shortness of breath: Secondary | ICD-10-CM

## 2018-01-11 DIAGNOSIS — R635 Abnormal weight gain: Secondary | ICD-10-CM | POA: Diagnosis not present

## 2018-01-11 DIAGNOSIS — R42 Dizziness and giddiness: Secondary | ICD-10-CM | POA: Diagnosis not present

## 2018-01-11 DIAGNOSIS — I10 Essential (primary) hypertension: Secondary | ICD-10-CM

## 2018-01-11 MED ORDER — HYDROCHLOROTHIAZIDE 25 MG PO TABS: 25.0000 mg | ORAL_TABLET | Freq: Every day | ORAL | 0 refills | Status: DC

## 2018-01-11 MED ORDER — IRBESARTAN-HYDROCHLOROTHIAZIDE 150-12.5 MG PO TABS: 1.0000 | ORAL_TABLET | Freq: Every day | ORAL | 0 refills | Status: DC

## 2018-01-11 NOTE — Progress Notes (Signed)
HPI: Terri Farrell is a 67 y.o. female who  has a past medical history of Arthritis and Depression.  she presents to [facility] today, 01/11/18,  for chief complaint of: Illness, concern for BP meds effects  Reports cold chills, dizziness like lightheadedness without headache or vision changes, SOB (no orthopnea, reports trouble breathing "when I"m really tired", itching around face about a hour after takes medicine. Last about 30 mins and resolves. It has been going on for about a month. She is worried it's due to new BP medicine. Has been on this for about 4 mos...     Past medical, surgical, social and family history reviewed:  Patient Active Problem List   Diagnosis Date Noted  . Left knee pain 08/19/2016  . Trochanteric bursitis of right hip 08/19/2016  . Chronic constipation 12/25/2014  . Osteoarthritis of right hip 11/21/2013  . GERD (gastroesophageal reflux disease) 11/16/2013  . Severe obesity (BMI >= 40) (HCC) 04/14/2013  . Essential hypertension, benign 05/13/2011  . Obesity 03/09/2011  . Insomnia 03/09/2011  . Anemia, iron deficiency 10/02/2010  . Depression, major, recurrent (HCC) 07/24/2010  . KNEE PAIN, RIGHT 07/24/2010  . POLYARTHRITIS 07/24/2010  . Fatigue 07/16/2010    Past Surgical History:  Procedure Laterality Date  . ABDOMINAL HYSTERECTOMY  1996   for fibroids  . CARPAL TUNNEL RELEASE     right and left  . CESAREAN SECTION     X 2  . TOTAL KNEE ARTHROPLASTY  08/2010    Social History   Tobacco Use  . Smoking status: Former Smoker    Packs/day: 1.00    Years: 2.00    Pack years: 2.00    Types: Cigarettes  . Smokeless tobacco: Never Used  Substance Use Topics  . Alcohol use: No    Family History  Problem Relation Age of Onset  . Diabetes Other        family hx of  . Breast cancer Paternal Aunt      Current medication list and allergy/intolerance information reviewed:    Current Outpatient  Medications  Medication Sig Dispense Refill  . fluticasone (FLONASE) 50 MCG/ACT nasal spray SPRAY 2 SPRAYS INTO EACH NOSTRIL EVERY DAY 16 g 3  . olmesartan-hydrochlorothiazide (BENICAR HCT) 20-12.5 MG tablet Take 1 tablet by mouth daily. 90 tablet 1   No current facility-administered medications for this visit.     Allergies  Allergen Reactions  . Aspirin Other (See Comments) and Shortness Of Breath    Other reaction(s): Dizziness  . Lisinopril Other (See Comments)    Fatigue  Other reaction(s): Other Fatigue  . Cymbalta [Duloxetine Hcl] Other (See Comments)    Sedation, inc hunger  . Sertraline Other (See Comments)    hallucinations  . Wellbutrin [Bupropion] Other (See Comments)    Patient reports the medication caused her to cry and be tired.       Review of Systems:  Constitutional:  No  fever, + chills, No recent illness, +unintentional weight gain. +significant fatigue.   HEENT: No  headache, no vision change, no hearing change, No sore throat, No  sinus pressure  Cardiac: No  chest pain, No  pressure, No palpitations  Respiratory:  +shortness of breath. No  Cough  Gastrointestinal: No  abdominal pain, +nausea, No  vomiting,  No  blood in stool, No  diarrhea  Musculoskeletal: No new myalgia/arthralgia  Skin: No  Rash  Neurologic: +generalized weakness, + lightheaded dizziness w/o vertigo, no speech changes  or confusion, no syncope or presyncope  Exam:  BP 140/61 (BP Location: Left Arm, Patient Position: Sitting, Cuff Size: Large)   Pulse 88   Temp 98.3 F (36.8 C) (Oral)   Wt 296 lb 11.2 oz (134.6 kg)   SpO2 96%   BMI 50.93 kg/m   Constitutional: VS see above. General Appearance: alert, well-developed, well-nourished, NAD  Eyes: Normal lids and conjunctive, non-icteric sclera  Ears, Nose, Mouth, Throat: MMM, Normal external inspection ears/nares/mouth/lips/gums, poor dentition. TM normal bilaterally. Pharynx/tonsils no erythema, no exudate. Nasal mucosa  normal.   Neck: No masses, trachea midline. No tenderness/mass appreciated. No lymphadenopathy  Respiratory: Normal respiratory effort. no wheeze, no rhonchi, no rales  Cardiovascular: S1/S2 normal, no murmur, no rub/gallop auscultated. RRR. No lower extremity edema.   Gastrointestinal: Nontender, no masses. Bowel sounds normal.  Musculoskeletal: Gait normal.   Neurological: Normal balance/coordination. No tremor.   Skin: warm, dry, intact.   Psychiatric: Normal judgment/insight. Normal mood and affect.   EKG interpretation: Rate: 82 Rhythm: sinus Stable inverted T waves V2 V3 No ST/T changes concerning for acute ischemia/infarct  Previous EKG 05/09/13 no major difference    Results for orders placed or performed in visit on 01/11/18 (from the past 72 hour(s))  CBC     Status: Abnormal   Collection Time: 01/11/18  2:20 PM  Result Value Ref Range   WBC 8.6 3.8 - 10.8 Thousand/uL   RBC 4.03 3.80 - 5.10 Million/uL   Hemoglobin 12.3 11.7 - 15.5 g/dL   HCT 91.4 78.2 - 95.6 %   MCV 89.1 80.0 - 100.0 fL   MCH 30.5 27.0 - 33.0 pg   MCHC 34.3 32.0 - 36.0 g/dL   RDW 21.3 08.6 - 57.8 %   Platelets 143 140 - 400 Thousand/uL   MPV 13.8 (H) 7.5 - 12.5 fL  TSH     Status: None   Collection Time: 01/11/18  2:20 PM  Result Value Ref Range   TSH 1.86 0.40 - 4.50 mIU/L  Magnesium     Status: None   Collection Time: 01/11/18  2:20 PM  Result Value Ref Range   Magnesium 1.7 1.5 - 2.5 mg/dL  VITAMIN D 25 Hydroxy (Vit-D Deficiency, Fractures)     Status: Abnormal   Collection Time: 01/11/18  2:20 PM  Result Value Ref Range   Vit D, 25-Hydroxy 9 (L) 30 - 100 ng/mL    Comment: Vitamin D Status         25-OH Vitamin D: . Deficiency:                    <20 ng/mL Insufficiency:             20 - 29 ng/mL Optimal:                 > or = 30 ng/mL . For 25-OH Vitamin D testing on patients on  D2-supplementation and patients for whom quantitation  of D2 and D3 fractions is required, the  Quest[employer]ureD(TM) 25-OH VIT D, (D2,D3), LC/MS/MS is recommended: order  code 46962 (patients >48yrs). . For more information on this test, go to: http://education.questdiagnostics.com/faq/FAQ163 (This link is being provided for  informational/educational purposes only.)   COMPLETE METABOLIC PANEL WITH GFR     Status: Abnormal   Collection Time: 01/11/18  2:20 PM  Result Value Ref Range   Glucose, Bld 109 65 - 139 mg/dL    Comment: .        Non-fasting reference  interval .    BUN 24 7 - 25 mg/dL   Creat 1.611.08 (H) 0.960.50 - 0.99 mg/dL    Comment: For patients >65 years of age, the reference limit for Creatinine is approximately 13% higher for people identified as African-American. .    GFR, Est Non African American 54 (L) > OR = 60 mL/min/1.5573m2   GFR, Est African American 62 > OR = 60 mL/min/1.2373m2   BUN/Creatinine Ratio 22 6 - 22 (calc)   Sodium 140 135 - 146 mmol/L   Potassium 4.6 3.5 - 5.3 mmol/L   Chloride 104 98 - 110 mmol/L   CO2 24 20 - 32 mmol/L   Calcium 9.9 8.6 - 10.4 mg/dL   Total Protein 7.5 6.1 - 8.1 g/dL   Albumin 4.4 3.6 - 5.1 g/dL   Globulin 3.1 1.9 - 3.7 g/dL (calc)   AG Ratio 1.4 1.0 - 2.5 (calc)   Total Bilirubin 0.5 0.2 - 1.2 mg/dL   Alkaline phosphatase (APISO) 57 33 - 130 U/L   AST 15 10 - 35 U/L   ALT 12 6 - 29 U/L  Urinalysis, Routine w reflex microscopic     Status: Abnormal   Collection Time: 01/11/18  2:20 PM  Result Value Ref Range   Color, Urine YELLOW YELLOW   APPearance CLEAR CLEAR   Specific Gravity, Urine 1.024 1.001 - 1.03   pH < OR = 5.0 5.0 - 8.0   Glucose, UA NEGATIVE NEGATIVE   Bilirubin Urine NEGATIVE NEGATIVE   Ketones, ur TRACE (A) NEGATIVE   Hgb urine dipstick NEGATIVE NEGATIVE   Protein, ur NEGATIVE NEGATIVE   Nitrite NEGATIVE NEGATIVE   Leukocytes, UA NEGATIVE NEGATIVE    Dg Chest 2 View  Result Date: 01/11/2018 CLINICAL DATA:  Shortness of breath with fatigue EXAM: CHEST - 2 VIEW COMPARISON:  08/18/2010 FINDINGS: The  heart size and mediastinal contours are within normal limits. Both lungs are clear. Degenerative changes of the spine. IMPRESSION: No active cardiopulmonary disease. Electronically Signed   By: Jasmine PangKim  Fujinaga M.D.   On: 01/11/2018 20:35     ASSESSMENT/PLAN:   Chronic fatigue - Plan: CBC, COMPLETE METABOLIC PANEL WITH GFR, TSH, Magnesium, Urinalysis, Routine w reflex microscopic, VITAMIN D 25 Hydroxy (Vit-D Deficiency, Fractures), DG Chest 2 View  Lightheaded - Plan: CBC, COMPLETE METABOLIC PANEL WITH GFR, TSH, Magnesium, Urinalysis, Routine w reflex microscopic, VITAMIN D 25 Hydroxy (Vit-D Deficiency, Fractures)  Dizziness, nonspecific - Plan: CBC, COMPLETE METABOLIC PANEL WITH GFR, TSH, Magnesium, Urinalysis, Routine w reflex microscopic, VITAMIN D 25 Hydroxy (Vit-D Deficiency, Fractures)  Abnormal weight gain - Plan: CBC, COMPLETE METABOLIC PANEL WITH GFR, TSH, Magnesium, Urinalysis, Routine w reflex microscopic, VITAMIN D 25 Hydroxy (Vit-D Deficiency, Fractures)  Essential hypertension, benign - Plan: CBC, COMPLETE METABOLIC PANEL WITH GFR, TSH, Magnesium, Urinalysis, Routine w reflex microscopic, VITAMIN D 25 Hydroxy (Vit-D Deficiency, Fractures)  Shortness of breath - Plan: DG Chest 2 View  Vitamin D deficiency    Patient Instructions  I think it's possible your symptoms may be due to the blood pressure medicine, so we can switch this medicine and have you schedule a visit with Dr Linford ArnoldMetheney in the next week or two to see how the change is working for you.   Will also get some lab work and chest Xray to make sure nothing else is going on. Your EKG today is not concerning. You had one early beat on the EKG but this is not likely anything significant. Will see how you're  feeling with medication change, we might think about following up with more heart testing depending on labs and symptoms.   I sent new medicine to your pharmacy, please discard or set aside the  olmesartan-hydrochlorothiazide.     Visit summary with medication list and pertinent instructions was printed for patient to review. All questions at time of visit were answered - patient instructed to contact office with any additional concerns. ER/RTC precautions were reviewed with the patient.   Follow-up plan: Return in about 1 week (around 01/18/2018) for recheck blood pressure with Dr Linford Arnold .    Please note: voice recognition software was used to produce this document, and typos may escape review. Please contact Dr. Lyn Hollingshead for any needed clarifications.

## 2018-01-11 NOTE — Patient Instructions (Addendum)
I think it's possible your symptoms may be due to the blood pressure medicine, so we can switch this medicine and have you schedule a visit with Dr Linford ArnoldMetheney in the next week or two to see how the change is working for you.   Will also get some lab work and chest Xray to make sure nothing else is going on. Your EKG today is not concerning. You had one early beat on the EKG but this is not likely anything significant. Will see how you're feeling with medication change, we might think about following up with more heart testing depending on labs and symptoms.   I sent new medicine to your pharmacy, please discard or set aside the olmesartan-hydrochlorothiazide.

## 2018-01-12 DIAGNOSIS — E559 Vitamin D deficiency, unspecified: Secondary | ICD-10-CM | POA: Insufficient documentation

## 2018-01-12 LAB — CBC
HCT: 35.9 % (ref 35.0–45.0)
Hemoglobin: 12.3 g/dL (ref 11.7–15.5)
MCH: 30.5 pg (ref 27.0–33.0)
MCHC: 34.3 g/dL (ref 32.0–36.0)
MCV: 89.1 fL (ref 80.0–100.0)
MPV: 13.8 fL — AB (ref 7.5–12.5)
Platelets: 143 10*3/uL (ref 140–400)
RBC: 4.03 10*6/uL (ref 3.80–5.10)
RDW: 12.8 % (ref 11.0–15.0)
WBC: 8.6 10*3/uL (ref 3.8–10.8)

## 2018-01-12 LAB — COMPLETE METABOLIC PANEL WITH GFR
AG Ratio: 1.4 (calc) (ref 1.0–2.5)
ALKALINE PHOSPHATASE (APISO): 57 U/L (ref 33–130)
ALT: 12 U/L (ref 6–29)
AST: 15 U/L (ref 10–35)
Albumin: 4.4 g/dL (ref 3.6–5.1)
BILIRUBIN TOTAL: 0.5 mg/dL (ref 0.2–1.2)
BUN/Creatinine Ratio: 22 (calc) (ref 6–22)
BUN: 24 mg/dL (ref 7–25)
CHLORIDE: 104 mmol/L (ref 98–110)
CO2: 24 mmol/L (ref 20–32)
Calcium: 9.9 mg/dL (ref 8.6–10.4)
Creat: 1.08 mg/dL — ABNORMAL HIGH (ref 0.50–0.99)
GFR, Est African American: 62 mL/min/{1.73_m2} (ref >=60)
GFR, Est Non African American: 54 mL/min/{1.73_m2} — ABNORMAL LOW (ref >=60)
GLUCOSE: 109 mg/dL (ref 65–139)
Globulin: 3.1 g/dL (calc) (ref 1.9–3.7)
Potassium: 4.6 mmol/L (ref 3.5–5.3)
Sodium: 140 mmol/L (ref 135–146)
Total Protein: 7.5 g/dL (ref 6.1–8.1)

## 2018-01-12 LAB — URINALYSIS, ROUTINE W REFLEX MICROSCOPIC
Bilirubin Urine: NEGATIVE
Glucose, UA: NEGATIVE
Hgb urine dipstick: NEGATIVE
LEUKOCYTES UA: NEGATIVE
NITRITE: NEGATIVE
PROTEIN: NEGATIVE
Specific Gravity, Urine: 1.024 (ref 1.001–1.03)
pH: <=5 (ref 5.0–8.0)

## 2018-01-12 LAB — VITAMIN D 25 HYDROXY (VIT D DEFICIENCY, FRACTURES): Vit D, 25-Hydroxy: 9 ng/mL — ABNORMAL LOW (ref 30–100)

## 2018-01-12 LAB — TSH: TSH: 1.86 mIU/L (ref 0.40–4.50)

## 2018-01-12 LAB — MAGNESIUM: Magnesium: 1.7 mg/dL (ref 1.5–2.5)

## 2018-01-12 MED ORDER — VITAMIN D (ERGOCALCIFEROL) 1.25 MG (50000 UNIT) PO CAPS: 50000.0000 [IU] | ORAL_CAPSULE | ORAL | 0 refills | Status: DC

## 2018-01-18 NOTE — Addendum Note (Signed)
Addended by: Collie SiadICHARDSON, Avonna Iribe M on: 01/18/2018 03:48 PM   Modules accepted: Orders

## 2018-02-01 ENCOUNTER — Ambulatory Visit: Payer: Medicare HMO | Admitting: Family Medicine

## 2018-02-02 ENCOUNTER — Other Ambulatory Visit: Payer: Self-pay | Admitting: Osteopathic Medicine

## 2018-02-03 ENCOUNTER — Other Ambulatory Visit: Payer: Self-pay | Admitting: Osteopathic Medicine

## 2018-02-06 ENCOUNTER — Other Ambulatory Visit: Payer: Self-pay | Admitting: Osteopathic Medicine

## 2018-02-08 ENCOUNTER — Other Ambulatory Visit: Payer: Self-pay | Admitting: *Deleted

## 2018-02-08 MED ORDER — HYDROCHLOROTHIAZIDE 25 MG PO TABS: 25.0000 mg | ORAL_TABLET | Freq: Every day | ORAL | 0 refills | Status: DC

## 2018-02-10 ENCOUNTER — Other Ambulatory Visit: Payer: Self-pay | Admitting: Family Medicine

## 2018-02-18 ENCOUNTER — Other Ambulatory Visit: Payer: Self-pay | Admitting: *Deleted

## 2018-02-18 MED ORDER — OLMESARTAN MEDOXOMIL-HCTZ 20-12.5 MG PO TABS: 1.0000 | ORAL_TABLET | Freq: Every day | ORAL | 1 refills | Status: DC

## 2018-03-03 ENCOUNTER — Encounter: Payer: Self-pay | Admitting: Family Medicine

## 2018-03-03 ENCOUNTER — Ambulatory Visit (INDEPENDENT_AMBULATORY_CARE_PROVIDER_SITE_OTHER): Payer: Medicare HMO | Admitting: Family Medicine

## 2018-03-03 VITALS — BP 122/72 | HR 72 | Ht 64.0 in | Wt 288.0 lb

## 2018-03-03 DIAGNOSIS — E559 Vitamin D deficiency, unspecified: Secondary | ICD-10-CM

## 2018-03-03 DIAGNOSIS — Z23 Encounter for immunization: Secondary | ICD-10-CM

## 2018-03-03 DIAGNOSIS — Z6841 Body Mass Index (BMI) 40.0 and over, adult: Secondary | ICD-10-CM

## 2018-03-03 DIAGNOSIS — R7989 Other specified abnormal findings of blood chemistry: Secondary | ICD-10-CM

## 2018-03-03 DIAGNOSIS — I10 Essential (primary) hypertension: Secondary | ICD-10-CM

## 2018-03-03 NOTE — Progress Notes (Signed)
Subjective:    CC: BP and labs  HPI:  Hypertension- Pt denies chest pain, SOB, dizziness, or heart palpitations.  Taking meds as directed w/o problems.  Denies medication side effects.  We changed her blood pressure medication because of recall.  But she was feeling more tired and fatigued and weak on the other medication so she was recently switched to omelsartan HCTZ and has been doing much better on this medication no side effects thus far.  She was also sick in July and came in she had a CMP performed at that time showed just a mild bump in her creatinine at 1.08.  It was likely that she was dehydrated.  Noted on her labs to have significantly low levels of vitamin D.  Vitamin D the level was 9.  Is been taking 50,000 units once a week since July.  Morbid obesity-she has actually lost about 8 pounds since she was last year in July.  She is really trying to get back on track.  She was doing really great with weight loss at one point and then got frustrated and concave up for a while.  But she is back on track with eating more fish and vegetables and is already down 8 pounds.  Past medical history, Surgical history, Family history not pertinant except as noted below, Social history, Allergies, and medications have been entered into the medical record, reviewed, and corrections made.   Review of Systems: No fevers, chills, night sweats, weight loss, chest pain, or shortness of breath.   Objective:    General: Well Developed, well nourished, and in no acute distress.  Neuro: Alert and oriented x3, extra-ocular muscles intact, sensation grossly intact.  HEENT: Normocephalic, atraumatic  Skin: Warm and dry, no rashes. Cardiac: Regular rate and rhythm, no murmurs rubs or gallops, no lower extremity edema.  Respiratory: Clear to auscultation bilaterally. Not using accessory muscles, speaking in full sentences.   Impression and Recommendations:    HTN - Well controlled on omelsartan HCTZ and  she is actually feeling much better on this regimen.. Continue current regimen. Follow up in 4 months.  Due for lipid and additional blood work at that time so asked her to come in fasting for that appointment.  Vit D def  - due to recheck level.  She did complete taking all of her capsules.  She hopefully has a much better vitamin D level but I suspect probably not completely at goal.  We discussed switching to over-the-counter medication she actually prefers the capsule so if we do need to continue them I will need to send a refill to her pharmacy.  Elevated Creatinine-we will plan to recheck levels today.  She was likely just dehydrated though she is on a diuretic.    Obesity/BMI 49-doing great on her current regimen.

## 2018-03-04 LAB — BASIC METABOLIC PANEL WITH GFR
BUN / CREAT RATIO: 22 (calc) (ref 6–22)
BUN: 29 mg/dL — AB (ref 7–25)
CHLORIDE: 102 mmol/L (ref 98–110)
CO2: 22 mmol/L (ref 20–32)
CREATININE: 1.31 mg/dL — AB (ref 0.50–0.99)
Calcium: 9.8 mg/dL (ref 8.6–10.4)
GFR, Est African American: 49 mL/min/{1.73_m2} — ABNORMAL LOW (ref >=60)
GFR, Est Non African American: 43 mL/min/{1.73_m2} — ABNORMAL LOW (ref >=60)
GLUCOSE: 108 mg/dL (ref 65–139)
Potassium: 4.3 mmol/L (ref 3.5–5.3)
Sodium: 141 mmol/L (ref 135–146)

## 2018-03-04 LAB — VITAMIN D 25 HYDROXY (VIT D DEFICIENCY, FRACTURES): Vit D, 25-Hydroxy: 29 ng/mL — ABNORMAL LOW (ref 30–100)

## 2018-03-05 ENCOUNTER — Other Ambulatory Visit: Payer: Self-pay | Admitting: Family Medicine

## 2018-03-05 DIAGNOSIS — E559 Vitamin D deficiency, unspecified: Secondary | ICD-10-CM

## 2018-03-05 MED ORDER — VITAMIN D (ERGOCALCIFEROL) 1.25 MG (50000 UNIT) PO CAPS: 50000.0000 [IU] | ORAL_CAPSULE | ORAL | 0 refills | Status: DC

## 2018-03-07 ENCOUNTER — Other Ambulatory Visit: Payer: Self-pay | Admitting: *Deleted

## 2018-03-07 DIAGNOSIS — R7989 Other specified abnormal findings of blood chemistry: Secondary | ICD-10-CM

## 2018-03-07 DIAGNOSIS — E559 Vitamin D deficiency, unspecified: Secondary | ICD-10-CM

## 2018-03-07 MED ORDER — VITAMIN D (ERGOCALCIFEROL) 1.25 MG (50000 UNIT) PO CAPS: 50000.0000 [IU] | ORAL_CAPSULE | ORAL | 0 refills | Status: DC

## 2018-04-08 ENCOUNTER — Encounter: Payer: Self-pay | Admitting: Emergency Medicine

## 2018-04-08 ENCOUNTER — Emergency Department (INDEPENDENT_AMBULATORY_CARE_PROVIDER_SITE_OTHER)
Admission: EM | Admit: 2018-04-08 | Discharge: 2018-04-08 | Disposition: A | Discharge status: Home / Self Care | Payer: Medicare HMO | Source: Home / Self Care | Attending: Family Medicine | Admitting: Family Medicine

## 2018-04-08 DIAGNOSIS — B9789 Other viral agents as the cause of diseases classified elsewhere: Secondary | ICD-10-CM

## 2018-04-08 DIAGNOSIS — D649 Anemia, unspecified: Secondary | ICD-10-CM

## 2018-04-08 DIAGNOSIS — J069 Acute upper respiratory infection, unspecified: Secondary | ICD-10-CM

## 2018-04-08 LAB — POCT CBC W AUTO DIFF (K'VILLE URGENT CARE)

## 2018-04-08 NOTE — ED Provider Notes (Signed)
[facility]    CSN: [id number] Arrival date & time: 04/08/18  1318     History   Chief Complaint Chief Complaint  Patient presents with  . Dizziness    HPI XOIE KREUSER is a 67 y.o. female.   Patient complains of mild dizziness upon standing for the past several weeks.  During the past 2 to 3 days she has developed cold chills, increased fatigue, cough, mild nausea without vomiting, and sneezing.  She has a history of perennial rhinitis, for which she uses Flonase, and has had increased sinus congestion.  No shortness of breath or pleuritic pain.  The history is provided by the patient.    Past Medical History:  Diagnosis Date  . Arthritis    RA  . Depression     Patient Active Problem List   Diagnosis Date Noted  . Vitamin D deficiency 01/12/2018  . Left knee pain 08/19/2016  . Trochanteric bursitis of right hip 08/19/2016  . Chronic constipation 12/25/2014  . Osteoarthritis of right hip 11/21/2013  . GERD (gastroesophageal reflux disease) 11/16/2013  . Severe obesity (BMI >= 40) (HCC) 04/14/2013  . Essential hypertension, benign 05/13/2011  . Obesity 03/09/2011  . Insomnia 03/09/2011  . Anemia, iron deficiency 10/02/2010  . Depression, major, recurrent (HCC) 07/24/2010  . KNEE PAIN, RIGHT 07/24/2010  . POLYARTHRITIS 07/24/2010  . Fatigue 07/16/2010    Past Surgical History:  Procedure Laterality Date  . ABDOMINAL HYSTERECTOMY  1996   for fibroids  . CARPAL TUNNEL RELEASE     right and left  . CESAREAN SECTION     X 2  . TOTAL KNEE ARTHROPLASTY  08/2010    OB History   None      Home Medications    Prior to Admission medications   Medication Sig Start Date End Date Taking? Authorizing Provider  hydrochlorothiazide (MICROZIDE) 12.5 MG capsule Take 12.5 mg by mouth daily.   Yes [provider]  fluticasone (FLONASE) 50 MCG/ACT nasal spray SPRAY 2 SPRAYS INTO EACH NOSTRIL EVERY DAY 11/18/17   Agapito Games, MD    olmesartan-hydrochlorothiazide (BENICAR HCT) 20-12.5 MG tablet Take 1 tablet by mouth daily. 02/18/18   Agapito Games, MD  Vitamin D, Ergocalciferol, (DRISDOL) 50000 units CAPS capsule Take 1 capsule (50,000 Units total) by mouth every 7 (seven) days. Take for 12 total doses(weeks) 03/07/18   Agapito Games, MD    Family History Family History  Problem Relation Age of Onset  . Diabetes Other        family hx of  . Breast cancer Paternal Aunt     Social History Social History   Tobacco Use  . Smoking status: Former Smoker    Packs/day: 1.00    Years: 2.00    Pack years: 2.00    Types: Cigarettes  . Smokeless tobacco: Never Used  Substance Use Topics  . Alcohol use: No  . Drug use: No     Allergies   Aspirin; Lisinopril; Cymbalta [duloxetine hcl]; Sertraline; and Wellbutrin [bupropion]   Review of Systems Review of Systems ? sore throat + cough + sneezing No pleuritic pain No wheezing + nasal congestion ? post-nasal drainage No sinus pain/pressure No itchy/red eyes No earache + dizziness No hemoptysis No SOB No fever, + chills + nausea No vomiting No abdominal pain No diarrhea No urinary symptoms No skin rash + fatigue No myalgias No headache Used OTC meds without relief   Physical Exam Triage Vital Signs ED  Triage Vitals  Enc Vitals Group     BP 04/08/18 1402 (!) 141/85     Pulse Rate 04/08/18 1402 84     Resp --      Temp 04/08/18 1402 98.2 F (36.8 C)     Temp Source 04/08/18 1402 Oral     SpO2 04/08/18 1402 96 %     Weight 04/08/18 1403 280 lb (127 kg)     Height --      Head Circumference --      Peak Flow --      Pain Score 04/08/18 1403 0     Pain Loc --      Pain Edu? --      Excl. in GC? --    Orthostatic VS for the past 24 hrs:  BP- Lying Pulse- Lying BP- Sitting Pulse- Sitting BP- Standing at 0 minutes Pulse- Standing at 0 minutes  04/08/18 1500 (!) 167/95 86 157/87 85 (!) 138/96 100    Updated Vital Signs BP  (!) 141/85 (BP Location: Right Arm)   Pulse 84   Temp 98.2 F (36.8 C) (Oral)   Wt 127 kg   SpO2 96%   BMI 48.06 kg/m   Visual Acuity Right Eye Distance:   Left Eye Distance:   Bilateral Distance:    Right Eye Near:   Left Eye Near:    Bilateral Near:     Physical Exam Nursing notes and Vital Signs reviewed. Appearance:  Patient appears stated age, and in no acute distress Eyes:  Pupils are equal, round, and reactive to light and accomodation.  Extraocular movement is intact.  Conjunctivae are not inflamed  Ears:  Canals normal.  Tympanic membranes normal.  Nose:  Mildly congested turbinates.  No sinus tenderness.   Pharynx:  Normal Neck:  Supple.  Enlarged posterior/lateral nodes are palpated bilaterally, tender to palpation on the left.   Lungs:  Clear to auscultation.  Breath sounds are equal.  Moving air well. Heart:  Regular rate and rhythm without murmurs, rubs, or gallops.  Abdomen:  Nontender without masses or hepatosplenomegaly.  Bowel sounds are present.  No CVA or flank tenderness.  Extremities:  No edema.  Skin:  No rash present.    UC Treatments / Results  Labs (all labs ordered are listed, but only abnormal results are displayed) Labs Reviewed  POCT CBC W AUTO DIFF ([facility]):  WBC 8.7; LY 21.4; MO 24.0; GR 54.6; Hgb 11.2; Platelets 168     EKG None  Radiology No results found.  Procedures Procedures (including critical care time)  Medications Ordered in UC Medications - No data to display  Initial Impression / Assessment and Plan / UC Course  I have reviewed the triage vital signs and the nursing notes.  Pertinent labs & imaging results that were available during my care of the patient were reviewed by me and considered in my medical decision making (see chart for details).    Early viral URI. There is no evidence of bacterial infection today.  Treat symptomatically for now  Review of records reveals gradual worsening of anemia, as  follows:   01/27/17, Hgb 14.2;  07/01/17, Hgb 13.9;  01/11/18, Hgb 12.3; Today, Hgb 11.2 Followup with Family Doctor for evaluation of anemia.  Final Clinical Impressions(s) / UC Diagnoses   Final diagnoses:  Viral URI with cough  Anemia, unspecified type     Discharge Instructions     As cold symptoms develop, try the following:  Take  plain guaifenesin (1200mg  extended release tabs such as Mucinex) twice daily, with plenty of water, for cough and congestion.  Get adequate rest.   Recommend using saline nasal spray several times daily and saline nasal irrigation (AYR is a common brand).  Use Flonase nasal spray each morning after using saline spray. Try warm salt water gargles for sore throat.  Stop all antihistamines for now, and other non-prescription cough/cold preparations. May take Ibuprofen 200mg , 4 tabs every 8 hours with food for headache, fever, etc. May take Delsym Cough Suppressant at bedtime for nighttime cough.       ED Prescriptions    None         Lattie Haw, MD 04/09/18 [phone]

## 2018-04-08 NOTE — ED Triage Notes (Signed)
Pt c/o dizziness, sneezing and some coughing for the last few weeks. States she took too much vitD. The rx is for one weekly and she took them daily. States she has not felt like herself since then.

## 2018-04-08 NOTE — Discharge Instructions (Addendum)
As cold symptoms develop, try the following:  Take plain guaifenesin (1200mg  extended release tabs such as Mucinex) twice daily, with plenty of water, for cough and congestion.  Get adequate rest.   Recommend using saline nasal spray several times daily and saline nasal irrigation (AYR is a common brand).  Use Flonase nasal spray each morning after using saline spray. Try warm salt water gargles for sore throat.  Stop all antihistamines for now, and other non-prescription cough/cold preparations. May take Ibuprofen 200mg , 4 tabs every 8 hours with food for headache, fever, etc. May take Delsym Cough Suppressant at bedtime for nighttime cough.

## 2018-04-18 ENCOUNTER — Ambulatory Visit (INDEPENDENT_AMBULATORY_CARE_PROVIDER_SITE_OTHER): Payer: Medicare HMO | Admitting: Family Medicine

## 2018-04-18 ENCOUNTER — Encounter: Payer: Self-pay | Admitting: Family Medicine

## 2018-04-18 VITALS — BP 131/61 | HR 81 | Ht 64.0 in | Wt 282.0 lb

## 2018-04-18 DIAGNOSIS — Z23 Encounter for immunization: Secondary | ICD-10-CM

## 2018-04-18 DIAGNOSIS — E559 Vitamin D deficiency, unspecified: Secondary | ICD-10-CM

## 2018-04-18 DIAGNOSIS — E611 Iron deficiency: Secondary | ICD-10-CM

## 2018-04-18 DIAGNOSIS — J019 Acute sinusitis, unspecified: Secondary | ICD-10-CM

## 2018-04-18 MED ORDER — AMOXICILLIN-POT CLAVULANATE 875-125 MG PO TABS: 1.0000 | ORAL_TABLET | Freq: Two times a day (BID) | ORAL | 0 refills | Status: DC

## 2018-04-18 NOTE — Progress Notes (Signed)
Subjective:    Patient ID: Terri Farrell, female    DOB: 07-16-1950, 67 y.o.   MRN: [id number]  HPI 67 year old female here today for follow-up for recent urgent care visit.  She is actually been feeling a little dizzy and lightheaded for couple weeks.  Over that time she developed some upper respiratory symptoms as well.  She was diagnosed with an upper respiratory infection as well as some mild anemia with a hemoglobin of 11.2.  She was encouraged to follow-up for the anemia.  Overall she is feeling some better.  No fevers but she has had some chills.  She feels like she still just congested and getting a lot of postnasal drip.  She says is giving her horrible breath.  She said symptoms for couple weeks at this point now.  She also was noted to have low hemoglobin she has a prior history of iron deficiency anemia-she says she really has not had the best diet and she is only been eating a couple times a day.  She is pretty much just about quit eating meat.  More recently though she is been trying to eat more iron rich foods and she found that her hemoglobin had dropped again.  She actually just had a colonoscopy in August of last year for similar issues and it was normal.  She was able to get her hemoglobin back up to normal in the spring.  Vitamin D deficiency-she accidentally took her 50,000 unit vitamin D daily instead of weekly.  She now has some over-the-counter vitamin D 1000 IU daily.  Wants to know if it is okay to take those.   Review of Systems  BP 131/61   Pulse 81   Ht 5\' 4"  (1.626 m)   Wt 282 lb (127.9 kg)   SpO2 100%   BMI 48.41 kg/m     Allergies  Allergen Reactions  . Aspirin Other (See Comments) and Shortness Of Breath    Other reaction(s): Dizziness  . Lisinopril Other (See Comments)    Fatigue  Other reaction(s): Other Fatigue  . Cymbalta [Duloxetine Hcl] Other (See Comments)    Sedation, inc hunger  . Sertraline Other (See Comments)    hallucinations  .  Wellbutrin [Bupropion] Other (See Comments)    Patient reports the medication caused her to cry and be tired.     Past Medical History:  Diagnosis Date  . Arthritis    RA  . Depression     Past Surgical History:  Procedure Laterality Date  . ABDOMINAL HYSTERECTOMY  1996   for fibroids  . CARPAL TUNNEL RELEASE     right and left  . CESAREAN SECTION     X 2  . TOTAL KNEE ARTHROPLASTY  08/2010    Social History   Socioeconomic History  . Marital status: Widowed    Spouse name: Not on file  . Number of children: Not on file  . Years of education: Not on file  . Highest education level: Not on file  Occupational History  . Not on file  Social Needs  . Financial resource strain: Not on file  . Food insecurity:    Worry: Not on file    Inability: Not on file  . Transportation needs:    Medical: Not on file    Non-medical: Not on file  Tobacco Use  . Smoking status: Former Smoker    Packs/day: 1.00    Years: 2.00    Pack years: 2.00  Types: Cigarettes  . Smokeless tobacco: Never Used  Substance and Sexual Activity  . Alcohol use: No  . Drug use: No  . Sexual activity: Not on file  Lifestyle  . Physical activity:    Days per week: Not on file    Minutes per session: Not on file  . Stress: Not on file  Relationships  . Social connections:    Talks on phone: Not on file    Gets together: Not on file    Attends religious service: Not on file    Active member of club or organization: Not on file    Attends meetings of clubs or organizations: Not on file    Relationship status: Not on file  . Intimate partner violence:    Fear of current or ex partner: Not on file    Emotionally abused: Not on file    Physically abused: Not on file    Forced sexual activity: Not on file  Other Topics Concern  . Not on file  Social History Narrative  . Not on file    Family History  Problem Relation Age of Onset  . Diabetes Other        family hx of  . Breast cancer  Paternal Aunt     Outpatient Encounter Medications as of 04/18/2018  Medication Sig  . fluticasone (FLONASE) 50 MCG/ACT nasal spray SPRAY 2 SPRAYS INTO EACH NOSTRIL EVERY DAY  . hydrochlorothiazide (MICROZIDE) 12.5 MG capsule Take 12.5 mg by mouth daily.  Marland Kitchen olmesartan-hydrochlorothiazide (BENICAR HCT) 20-12.5 MG tablet Take 1 tablet by mouth daily.  . Vitamin D, Ergocalciferol, (DRISDOL) 50000 units CAPS capsule Take 1 capsule (50,000 Units total) by mouth every 7 (seven) days. Take for 12 total doses(weeks)  . amoxicillin-clavulanate (AUGMENTIN) 875-125 MG tablet Take 1 tablet by mouth 2 (two) times daily.   No facility-administered encounter medications on file as of 04/18/2018.          Objective:   Physical Exam  Constitutional: She is oriented to person, place, and time. She appears well-developed and well-nourished.  HENT:  Head: Normocephalic and atraumatic.  Right Ear: External ear normal.  Left Ear: External ear normal.  Nose: Nose normal.  Mouth/Throat: Oropharynx is clear and moist.  TMs and canals are clear.   Eyes: Pupils are equal, round, and reactive to light. Conjunctivae and EOM are normal.  Neck: Neck supple. No thyromegaly present.  Cardiovascular: Normal rate, regular rhythm and normal heart sounds.  Pulmonary/Chest: Effort normal and breath sounds normal. She has no wheezes.  Lymphadenopathy:    She has no cervical adenopathy.  Neurological: She is alert and oriented to person, place, and time.  Skin: Skin is warm and dry.  Psychiatric: She has a normal mood and affect.       Assessment & Plan:  Acute sinusitis-we will treat with Augmentin.  If not improving then please let us know.  Okay to take Claritin as well as she is been having some inner ear itching.  Iron deficiency-encouraged her to restart her iron.  Also work on iron rich diet.  She is due to recheck her vitamin D in about 2 months we will plan to recheck her iron at the same time.  Vitamin D  deficiency-okay to start the 1000 IU daily.  She accidentally took the once weekly dosing daily.

## 2018-04-18 NOTE — Patient Instructions (Signed)
Restart your iron Ok to take vitamin D 1000 IU daily.   Complete the antibiotic and let us know if you are not feeling better.

## 2018-05-04 ENCOUNTER — Other Ambulatory Visit: Payer: Self-pay | Admitting: *Deleted

## 2018-05-04 ENCOUNTER — Other Ambulatory Visit: Payer: Self-pay | Admitting: Family Medicine

## 2018-05-07 ENCOUNTER — Other Ambulatory Visit: Payer: Self-pay | Admitting: Family Medicine

## 2018-05-11 ENCOUNTER — Telehealth: Payer: Self-pay | Admitting: Family Medicine

## 2018-05-11 ENCOUNTER — Other Ambulatory Visit: Payer: Self-pay | Admitting: Family Medicine

## 2018-05-11 ENCOUNTER — Other Ambulatory Visit: Payer: Self-pay | Admitting: *Deleted

## 2018-05-11 NOTE — Telephone Encounter (Signed)
Please call patient and let her know that the new prescription called omelsartan already has the HCT in it so she does not need to pick up a separate prescription.

## 2018-05-11 NOTE — Telephone Encounter (Signed)
Called pt and lvm advising her that she should NOT be taking HCTZ. Told her that this medication was discontinued on 02/18/2018 and that she should ONLY BE TAKING BENICAR-HCT 20-12.5 . Advised her if she has any questions to call back and speak with a nurse regarding this.Laureen Ochs.Shepard Keltz, Viann Shoveonya Lynetta, CMA

## 2018-05-20 NOTE — Telephone Encounter (Signed)
Patient advised of recommendations. She will only take the Benicar HCT.

## 2018-06-02 ENCOUNTER — Other Ambulatory Visit: Payer: Self-pay | Admitting: Family Medicine

## 2018-06-16 ENCOUNTER — Other Ambulatory Visit (HOSPITAL_BASED_OUTPATIENT_CLINIC_OR_DEPARTMENT_OTHER): Payer: Self-pay | Admitting: Family Medicine

## 2018-06-16 ENCOUNTER — Other Ambulatory Visit: Payer: Self-pay | Admitting: Family Medicine

## 2018-06-16 DIAGNOSIS — Z1239 Encounter for other screening for malignant neoplasm of breast: Secondary | ICD-10-CM

## 2018-06-23 ENCOUNTER — Telehealth: Payer: Self-pay

## 2018-06-23 NOTE — Telephone Encounter (Signed)
Mailed DMV Placard Form to Home Address

## 2018-06-24 ENCOUNTER — Other Ambulatory Visit: Payer: Self-pay | Admitting: Family Medicine

## 2018-06-24 DIAGNOSIS — E559 Vitamin D deficiency, unspecified: Secondary | ICD-10-CM

## 2018-07-05 ENCOUNTER — Ambulatory Visit (INDEPENDENT_AMBULATORY_CARE_PROVIDER_SITE_OTHER): Payer: Medicare HMO | Admitting: Family Medicine

## 2018-07-05 ENCOUNTER — Encounter: Payer: Self-pay | Admitting: Family Medicine

## 2018-07-05 ENCOUNTER — Telehealth: Payer: Self-pay | Admitting: *Deleted

## 2018-07-05 VITALS — BP 117/52 | HR 75 | Ht 64.0 in | Wt 280.0 lb

## 2018-07-05 DIAGNOSIS — I10 Essential (primary) hypertension: Secondary | ICD-10-CM | POA: Diagnosis not present

## 2018-07-05 DIAGNOSIS — R2681 Unsteadiness on feet: Secondary | ICD-10-CM | POA: Diagnosis not present

## 2018-07-05 DIAGNOSIS — M2550 Pain in unspecified joint: Secondary | ICD-10-CM | POA: Diagnosis not present

## 2018-07-05 DIAGNOSIS — M1611 Unilateral primary osteoarthritis, right hip: Secondary | ICD-10-CM

## 2018-07-05 DIAGNOSIS — T50905A Adverse effect of unspecified drugs, medicaments and biological substances, initial encounter: Secondary | ICD-10-CM | POA: Diagnosis not present

## 2018-07-05 MED ORDER — HYDROCHLOROTHIAZIDE 12.5 MG PO CAPS: 12.5000 mg | ORAL_CAPSULE | Freq: Every day | ORAL | 1 refills | Status: DC

## 2018-07-05 MED ORDER — AMBULATORY NON FORMULARY MEDICATION: 0 refills | Status: DC

## 2018-07-05 NOTE — Progress Notes (Signed)
Subjective:    CC: BP f/u  HPI:  HTN -there is been some major confusion around her blood pressure medication.  When she came in in September we actually thought she was taking omelsartan HCTZ.  We had sent a new prescription a couple weeks prior to that visit.  It turns out she had actually not picked it up and she was actually on the HCTZ when she came in but we were unaware.  At the time she was actually feeling better and her blood pressure was well controlled.  So then she tried to actually refill the hydrochlorothiazide and we denied it because we thought she was taking the omelsartan.  She did pick up the omelsartan and unfortunately had side effects.  She said she took it for about 2-1/2 weeks but had significant GI side effects reflux and chest pain with it she also felt a little bit confused so she discontinued it.  Prior to that she had taken losartan which she feels made her feel weak and tired.  She feels good on just the 12.5 mg of HCTZ.  Is otherwise been feeling okay and doing well.  She still juggling with some gait instability.  She has been trying to get a Rollator for the last 2 years in fact I did look through my records and we had sent a prescription in 2018 and then another one in 2019 and she says she never received the Rollator.  The last one was sent to [facility] pharmacy which is now closed.  Artelia Laroche [facility].  Past medical history, Surgical history, Family history not pertinant except as noted below, Social history, Allergies, and medications have been entered into the medical record, reviewed, and corrections made.   Review of Systems: No fevers, chills, night sweats, weight loss, chest pain, or shortness of breath.   Objective:    General: Well Developed, well nourished, and in no acute distress.  Neuro: Alert and oriented x3, extra-ocular muscles intact, sensation grossly intact.  HEENT: Normocephalic, atraumatic  Skin: Warm and dry, no  rashes. Cardiac: Regular rate and rhythm, no murmurs rubs or gallops, no lower extremity edema.  Respiratory: Clear to auscultation bilaterally. Not using accessory muscles, speaking in full sentences.   Impression and Recommendations:   Medication Side effect -added losartan and omelsartan to her side effect/contraindication list.  We do need to add another medication for blood pressure control at some point consider calcium channel blocker.  HTN -pressure looks fantastic on just the plain HCTZ today so we will get a continue with that current regimen and continue to monitor it.  She is due for some up-to-date blood work so lab slip was printed today.  Gait instability and right hip pain and polyarthralgia - New prescription for Rollator written and will try to [facility] to [facility] and see if they could actually deliver this to her home.

## 2018-07-05 NOTE — Telephone Encounter (Signed)
Order for Rollator faxed to [facility], on fax cover sheet asked that someone call our office to verify that once this has been delivered to patient.Heath Gold, CMA  Fax confirmation received .Heath Gold, CMA

## 2018-07-06 LAB — COMPLETE METABOLIC PANEL WITH GFR
AG RATIO: 1.3 (calc) (ref 1.0–2.5)
ALT: 11 U/L (ref 6–29)
AST: 16 U/L (ref 10–35)
Albumin: 4.4 g/dL (ref 3.6–5.1)
Alkaline phosphatase (APISO): 50 U/L (ref 33–130)
BILIRUBIN TOTAL: 0.5 mg/dL (ref 0.2–1.2)
BUN/Creatinine Ratio: 18 (calc) (ref 6–22)
BUN: 19 mg/dL (ref 7–25)
CHLORIDE: 101 mmol/L (ref 98–110)
CO2: 24 mmol/L (ref 20–32)
Calcium: 10.3 mg/dL (ref 8.6–10.4)
Creat: 1.07 mg/dL — ABNORMAL HIGH (ref 0.50–0.99)
GFR, EST AFRICAN AMERICAN: 63 mL/min/{1.73_m2} (ref >=60)
GFR, Est Non African American: 54 mL/min/{1.73_m2} — ABNORMAL LOW (ref >=60)
GLOBULIN: 3.3 g/dL (ref 1.9–3.7)
Glucose, Bld: 106 mg/dL — ABNORMAL HIGH (ref 65–99)
POTASSIUM: 4.1 mmol/L (ref 3.5–5.3)
SODIUM: 139 mmol/L (ref 135–146)
TOTAL PROTEIN: 7.7 g/dL (ref 6.1–8.1)

## 2018-07-06 LAB — LIPID PANEL
Cholesterol: 207 mg/dL — ABNORMAL HIGH (ref <200)
HDL: 60 mg/dL (ref >50)
LDL Cholesterol (Calc): 124 mg/dL (calc) — ABNORMAL HIGH
NON-HDL CHOLESTEROL (CALC): 147 mg/dL — AB (ref <130)
Total CHOL/HDL Ratio: 3.5 (calc) (ref <5.0)
Triglycerides: 123 mg/dL (ref <150)

## 2018-07-11 DIAGNOSIS — M1611 Unilateral primary osteoarthritis, right hip: Secondary | ICD-10-CM | POA: Diagnosis not present

## 2018-07-11 DIAGNOSIS — R2681 Unsteadiness on feet: Secondary | ICD-10-CM | POA: Diagnosis not present

## 2018-07-11 DIAGNOSIS — M2550 Pain in unspecified joint: Secondary | ICD-10-CM | POA: Diagnosis not present

## 2018-08-18 NOTE — Progress Notes (Signed)
Subjective:   Terri Farrell is a 68 y.o. female who presents for Medicare Annual (Subsequent) preventive examination.  Review of Systems:  No ROS.  Medicare Wellness Visit. Additional risk factors are reflected in the social history.  Cardiac Risk Factors include: advanced age (>67men, >54 women);obesity (BMI >30kg/m2);sedentary lifestyle;hypertension Sleep patterns: Getting on average 7 hours of sleep. Wakes up 3 times during the night to go void. Wakes up feeling groggy at times. Home Safety/Smoke Alarms: Feels safe in home. Smoke alarms in place.  Living environment;  Lives alone in an apartment for senior citizens. No steps in the home. Shower is walk in shower with grab bars in place. Seat Belt Safety/Bike Helmet: Wears seat belt.   Female:   Pap- aged out    Mammo-utd       Dexa scan-  Bone density     CCS- utd      Objective:     Vitals: BP 124/82 (BP Location: Left Arm, Patient Position: Sitting, Cuff Size: Large)   Pulse (!) 56   Ht 5\' 4"  (1.626 m)   Wt 278 lb (126.1 kg)   SpO2 96%   BMI 47.72 kg/m   Body mass index is 47.72 kg/m.  Advanced Directives 08/24/2018  Does Patient Have a Medical Advance Directive? Yes  Type of Estate agent of [place];Living will  Does patient want to make changes to medical advance directive? No - Patient declined  Copy of Healthcare Power of Attorney in Chart? No - copy requested    Tobacco Social History   Tobacco Use  Smoking Status Former Smoker  . Packs/day: 1.00  . Years: 2.00  . Pack years: 2.00  . Types: Cigarettes  Smokeless Tobacco Never Used     Counseling given: Not Answered   Clinical Intake:  Pre-visit preparation completed: Yes  Pain : No/denies pain     Nutritional Risks: None Diabetes: No  How often do you need to have someone help you when you read instructions, pamphlets, or other written materials from your doctor or pharmacy?: 1 - Never What is the last grade level  you completed in school?: 14  Interpreter Needed?: No  Information entered by :: Carolin Sicks, LPN  Past Medical History:  Diagnosis Date  . Arthritis    RA  . Depression    Past Surgical History:  Procedure Laterality Date  . ABDOMINAL HYSTERECTOMY  1996   for fibroids  . CARPAL TUNNEL RELEASE     right and left  . CESAREAN SECTION     X 2  . TOTAL KNEE ARTHROPLASTY  08/2010   Family History  Problem Relation Age of Onset  . Diabetes Other        family hx of  . Breast cancer Paternal Aunt    Social History   Socioeconomic History  . Marital status: Widowed    Spouse name: Not on file  . Number of children: 2  . Years of education: 78  . Highest education level: Some college, no degree  Occupational History  . Occupation: Location manager    Comment: retired  Engineer, production  . Financial resource strain: Not hard at all  . Food insecurity:    Worry: Never true    Inability: Never true  . Transportation needs:    Medical: No    Non-medical: No  Tobacco Use  . Smoking status: Former Smoker    Packs/day: 1.00    Years: 2.00    Pack years:  2.00    Types: Cigarettes  . Smokeless tobacco: Never Used  Substance and Sexual Activity  . Alcohol use: No  . Drug use: No  . Sexual activity: Not Currently  Lifestyle  . Physical activity:    Days per week: 0 days    Minutes per session: 0 min  . Stress: Not at all  Relationships  . Social connections:    Talks on phone: More than three times a week    Gets together: Never    Attends religious service: More than 4 times per year    Active member of club or organization: No    Attends meetings of clubs or organizations: Never    Relationship status: Widowed  Other Topics Concern  . Not on file  Social History Narrative   Gets on computer a lot during the day   Cleans house and watches TV   No caffeine use now for 3 weeks    Outpatient Encounter Medications as of 08/24/2018  Medication Sig  . AMBULATORY NON  FORMULARY MEDICATION Medication Name: Rollator with seat.  Dx: generalized weakness. Osteoarthritis of lower legs.  Gait instability.  . fluticasone (FLONASE) 50 MCG/ACT nasal spray SPRAY 2 SPRAYS INTO EACH NOSTRIL EVERY DAY  . hydrochlorothiazide (MICROZIDE) 12.5 MG capsule Take 1 capsule (12.5 mg total) by mouth daily.  . Vitamin D, Ergocalciferol, (DRISDOL) 1.25 MG (50000 UT) CAPS capsule TAKE 1 CAPSULE (50,000 UNITS TOTAL) BY MOUTH EVERY 7 (SEVEN) DAYS. TAKE FOR 12 TOTAL DOSES(WEEKS)   No facility-administered encounter medications on file as of 08/24/2018.     Activities of Daily Living In your present state of health, do you have any difficulty performing the following activities: 08/24/2018  Hearing? N  Vision? N  Difficulty concentrating or making decisions? N  Walking or climbing stairs? Y  Comment takes them slow  Dressing or bathing? N  Doing errands, shopping? N  Preparing Food and eating ? N  Using the Toilet? N  In the past six months, have you accidently leaked urine? Y  Comment when needs to go has to huury and get to the bathroom or will leak  Do you have problems with loss of bowel control? N  Managing your Medications? N  Managing your Finances? N  Housekeeping or managing your Housekeeping? N  Some recent data might be hidden    Patient Care Team: Agapito GamesMetheney, Catherine D, MD as PCP - General (Family Medicine)    Assessment:   This is a routine wellness examination for Gavin PoundDeborah.Physical assessment deferred to PCP.   Exercise Activities and Dietary recommendations Current Exercise Habits: The patient does not participate in regular exercise at present, Exercise limited by: orthopedic condition(s) Diet Eats a healthy diet of vegetables and fruits. Has lost weight by changing her diet Breakfast: scrambled eggs and oninon. Orange or a apple Lunch: skips Dinner: liver, or tuna, chicken, vegetables.   Drinks 8 bottles of water daily    Goals    . Patient Stated      Would like to have her teeth fixed this year. It is on her list of things to get done. Will make her feel better about herself, insteal confidence    . Weight (lb) < 200 lb (90.7 kg)     Continue to loose weight. Has lost 20lbs so far. Great job       Fall Risk Fall Risk  08/24/2018 07/05/2018 07/01/2017 01/27/2017 11/22/2015  Falls in the past year? 0 0 No No Yes  Number falls in past yr: - 0 - - 1  Injury with Fall? - 0 - - No  Risk for fall due to : Impaired balance/gait;Impaired mobility Impaired mobility;Impaired balance/gait - - Impaired mobility  Follow up Falls prevention discussed Falls prevention discussed - - Falls prevention discussed   Is the patient's home free of loose throw rugs in walkways, pet beds, electrical cords, etc?   yes      Grab bars in the bathroom? yes      Handrails on the stairs?   no      Adequate lighting?   yes   Depression Screen PHQ 2/9 Scores 08/24/2018 07/05/2018 03/03/2018 08/31/2017  PHQ - 2 Score 0 PHQ- 9 Score - - 5 8     Cognitive Function     6CIT Screen 08/24/2018  What Year? 0 points  What month? 0 points  What time? 0 points  Count back from 20 0 points  Months in reverse 0 points  Repeat phrase 2 points  Total Score 2    Immunization History  Administered Date(s) Administered  . Influenza Split 04/11/2012  . Influenza Whole 03/17/2013  . Influenza,inj,Quad PF,6+ Mos 02/16/2014, 04/01/2017, 03/03/2018, 04/18/2018  . Influenza-Unspecified 04/08/2015, 03/18/2016, 04/01/2017  . Tdap 06/12/2011    Screening Tests Health Maintenance  Topic Date Due  . DEXA SCAN  08/29/2017  . PNA vac Low Risk Adult (1 of 2 - PCV13) 03/04/2019 (Originally 08/29/2017)  . MAMMOGRAM  07/02/2019  . TETANUS/TDAP  06/11/2021  . COLONOSCOPY  02/03/2027  . INFLUENZA VACCINE  Completed  . Hepatitis C Screening  Completed  . HIV Screening  Completed       Plan:    Ms. Wellen , Thank you for taking time to come for your Medicare Wellness  Visit. I appreciate your ongoing commitment to your health goals. Please review the following plan we discussed and let me know if I can assist you in the future.   Please schedule your next medicare wellness visit with me in 1 yr. Bring a copy of your living will and/or healthcare power of attorney to your next office visit. Continue doing brain stimulating activities (puzzles, reading, adult coloring books, staying active) to keep memory sharp.       These are the goals we discussed: Goals    . Patient Stated     Would like to have her teeth fixed this year. It is on her list of things to get done. Will make her feel better about herself, insteal confidence    . Weight (lb) < 200 lb (90.7 kg)     Continue to loose weight. Has lost 20lbs so far. Great job       This is a list of the screening recommended for you and due dates:  Health Maintenance  Topic Date Due  . DEXA scan (bone density measurement)  08/29/2017  . Pneumonia vaccines (1 of 2 - PCV13) 03/04/2019*  . Mammogram  07/02/2019  . Tetanus Vaccine  06/11/2021  . Colon Cancer Screening  02/03/2027  . Flu Shot  Completed  .  Hepatitis C: One time screening is recommended by Center for Disease Control  (CDC) for  adults born from [birth year] through 1963.   Completed  . HIV Screening  Completed  *Topic was postponed. The date shown is not the original due date.     I have personally reviewed and noted the following in the patient's chart:   . Medical  and social history . Use of alcohol, tobacco or illicit drugs  . Current medications and supplements . Functional ability and status . Nutritional status . Physical activity . Advanced directives . List of other physicians . Hospitalizations, surgeries, and ER visits in previous 12 months . Vitals . Screenings to include cognitive, depression, and falls . Referrals and appointments  In addition, I have reviewed and discussed with patient certain preventive protocols,  quality metrics, and best practice recommendations. A written personalized care plan for preventive services as well as general preventive health recommendations were provided to patient.     Normand Sloop, LPN  4/43/1540

## 2018-08-24 ENCOUNTER — Ambulatory Visit (INDEPENDENT_AMBULATORY_CARE_PROVIDER_SITE_OTHER): Payer: Medicare HMO | Admitting: *Deleted

## 2018-08-24 ENCOUNTER — Other Ambulatory Visit: Payer: Self-pay

## 2018-08-24 ENCOUNTER — Ambulatory Visit (INDEPENDENT_AMBULATORY_CARE_PROVIDER_SITE_OTHER): Payer: Medicare HMO

## 2018-08-24 VITALS — BP 124/82 | HR 56 | Ht 64.0 in | Wt 278.0 lb

## 2018-08-24 DIAGNOSIS — Z780 Asymptomatic menopausal state: Secondary | ICD-10-CM | POA: Diagnosis not present

## 2018-08-24 DIAGNOSIS — Z1231 Encounter for screening mammogram for malignant neoplasm of breast: Secondary | ICD-10-CM | POA: Diagnosis not present

## 2018-08-24 DIAGNOSIS — Z1239 Encounter for other screening for malignant neoplasm of breast: Secondary | ICD-10-CM

## 2018-08-24 DIAGNOSIS — Z0000 Encounter for general adult medical examination without abnormal findings: Secondary | ICD-10-CM

## 2018-08-24 DIAGNOSIS — Z13820 Encounter for screening for osteoporosis: Secondary | ICD-10-CM

## 2018-08-24 NOTE — Progress Notes (Signed)
All labs are normal. 

## 2018-08-24 NOTE — Patient Instructions (Signed)
Terri Farrell , Thank you for taking time to come for your Medicare Wellness Visit. I appreciate your ongoing commitment to your health goals. Please review the following plan we discussed and let me know if I can assist you in the future.   Please schedule your next medicare wellness visit with me in 1 yr. Bring a copy of your living will and/or healthcare power of attorney to your next office visit. Continue doing brain stimulating activities (puzzles, reading, adult coloring books, staying active) to keep memory sharp.  These are the goals we discussed: Goals    . Patient Stated     Would like to have her teeth fixed this year. It is on her list of things to get done. Will make her feel better about herself, insteal confidence    . Weight (lb) < 200 lb (90.7 kg)     Continue to loose weight. Has lost 20lbs so far. Great job

## 2018-11-03 ENCOUNTER — Encounter: Payer: Self-pay | Admitting: Family Medicine

## 2018-11-03 ENCOUNTER — Ambulatory Visit: Payer: Medicare HMO | Admitting: Family Medicine

## 2018-11-03 ENCOUNTER — Ambulatory Visit (INDEPENDENT_AMBULATORY_CARE_PROVIDER_SITE_OTHER): Payer: Medicare HMO | Admitting: Family Medicine

## 2018-11-03 VITALS — BP 126/76 | HR 71 | Ht 64.0 in | Wt 280.6 lb

## 2018-11-03 DIAGNOSIS — N183 Chronic kidney disease, stage 3 unspecified: Secondary | ICD-10-CM

## 2018-11-03 DIAGNOSIS — J302 Other seasonal allergic rhinitis: Secondary | ICD-10-CM | POA: Diagnosis not present

## 2018-11-03 DIAGNOSIS — I10 Essential (primary) hypertension: Secondary | ICD-10-CM | POA: Diagnosis not present

## 2018-11-03 NOTE — Progress Notes (Signed)
Advised her to recheck her BP before her tele visit w/Dr. Linford Arnold.  Pt reports that she has stopped drinking coffee and feels better.   Laureen Ochs, Viann Shove, CMA

## 2018-11-03 NOTE — Progress Notes (Signed)
Virtual Visit via Video Note  I connected with Terri Farrell on 11/03/18 at 11:10 AM EDT by a video enabled telemedicine application and verified that I am speaking with the correct person using two identifiers.   I discussed the limitations of evaluation and management by telemedicine and the availability of in person appointments. The patient expressed understanding and agreed to proceed.  Subjective:    CC: BP check  HPI:  Hypertension- Pt denies chest pain, SOB, dizziness, or heart palpitations.  Taking meds as directed w/o problems.  Denies medication side effects.  Pt reports that she has stopped drinking coffee about 2 months ago and feels better. She has been drinking green tea. No heart flutters.  SHe has had some occ swelling.  She says hwe swelling is better when she gets up in the AM and increases as the day goes on.   She has been staying home and does wear her mask and gloves if she does go out.  There her daughter has been doing most of the grocery shopping for her.  Her weight has been up and down bc she has to eat what she has at home. She has been eating things she doesn't normal.   Seasonall allergies. _ using her flonase recently and taking an OTC allergy problems.   Past medical history, Surgical history, Family history not pertinant except as noted below, Social history, Allergies, and medications have been entered into the medical record, reviewed, and corrections made.   Review of Systems: No fevers, chills, night sweats, weight loss, chest pain, or shortness of breath.   Objective:    General: Speaking clearly in complete sentences without any shortness of breath.  Alert and oriented x3.  Normal judgment. No apparent acute distress.    Impression and Recommendations:    HTN - BP is well controlled.  She has cut back on caffeine. Due for BMP in July.  She can go at that time.  Continue to work on her weight and eating healthy as best that she is able  to.  Seasonal allergies - continue Flonase PRN.   CKD 3 - due to recheck renal function in July.  Encouraged her to put it on her calendar to come and get that drawn July if she feels safe to do so.     I discussed the assessment and treatment plan with the patient. The patient was provided an opportunity to ask questions and all were answered. The patient agreed with the plan and demonstrated an understanding of the instructions.   The patient was advised to call back or seek an in-person evaluation if the symptoms worsen or if the condition fails to improve as anticipated.   Nani Gasser, MD

## 2018-12-11 ENCOUNTER — Other Ambulatory Visit: Payer: Self-pay | Admitting: Family Medicine

## 2019-01-12 DIAGNOSIS — I10 Essential (primary) hypertension: Secondary | ICD-10-CM | POA: Diagnosis not present

## 2019-01-12 DIAGNOSIS — N183 Chronic kidney disease, stage 3 (moderate): Secondary | ICD-10-CM | POA: Diagnosis not present

## 2019-01-13 LAB — BASIC METABOLIC PANEL WITH GFR
BUN: 16 mg/dL (ref 7–25)
CO2: 25 mmol/L (ref 20–32)
Calcium: 10 mg/dL (ref 8.6–10.4)
Chloride: 102 mmol/L (ref 98–110)
Creat: 0.93 mg/dL (ref 0.50–0.99)
GFR, Est African American: 74 mL/min/{1.73_m2} (ref >=60)
GFR, Est Non African American: 64 mL/min/{1.73_m2} (ref >=60)
Glucose, Bld: 85 mg/dL (ref 65–99)
Potassium: 4.1 mmol/L (ref 3.5–5.3)
Sodium: 141 mmol/L (ref 135–146)

## 2019-01-13 LAB — MICROALBUMIN / CREATININE URINE RATIO
Creatinine, Urine: 78 mg/dL (ref 20–275)
Microalb Creat Ratio: 3 mcg/mg creat (ref <30)
Microalb, Ur: 0.2 mg/dL

## 2019-03-13 ENCOUNTER — Telehealth: Payer: Self-pay | Admitting: Family Medicine

## 2019-03-13 NOTE — Telephone Encounter (Signed)
Please call patient.  We did get a get a copy of her flu vaccine so get the entered into her chart.  She is also due for pneumonia vaccine so we would like to get that updated when she comes in next time

## 2019-03-14 NOTE — Telephone Encounter (Signed)
Pt advised.

## 2019-04-07 ENCOUNTER — Telehealth (HOSPITAL_COMMUNITY): Payer: Self-pay | Admitting: Vascular Surgery

## 2019-04-07 NOTE — Progress Notes (Signed)
Anesthesia APP Note: I called and spoke with Terri Farrell this afternoon and obtained verbal consent to review hospitalization and anesthesia records surrounding her right TKA on 08/20/10. Her daughter [name] (DOB 08/22/1970) is scheduled for upcoming surgery and reported that her mother had a post-operative complication of fever. Neither [name] or [name] could recall hearing the diagnosis of malignant hyperthermia, but Shada Nienaber thought her fever may have been related to anesthesia as infection was not found. Fever was only at night, and only lasted a few days. In hopes to learn more specifics about her reaction, I reviewed the progress notes, operative note, anesthesia record, cardiology consult, and discharge note pertaining to that surgery. There is no indication in these notes that suggest concern for malignant hyperthermia. She did receive GA and a femoral nerve block. Per handwritten anesthesia records, it looks like "Sevo" (sevoflurane) was utilized. She apparently developed chest pain in PACU, and was given IV nitroglycerin and ASA. Anesthesiologist Roberts Gaudy, MD actually checked on her on 08/22/10 and note does not mention any concern about fever and primarily noted that she did not experience a cardiac event and was having a "stable postop course". Progress notes and discharge summary do not document any high fevers. She was discharged on 08/24/10. Based on records reviewed, I do not see that there was any suspicion for malignant hyperthermia.   Myra Gianotti, PA-C Surgical Short Stay/Anesthesiology [facility] Phone [phone] 04/07/2019 5:45 PM

## 2019-05-08 ENCOUNTER — Other Ambulatory Visit: Payer: Self-pay

## 2019-05-08 ENCOUNTER — Encounter: Payer: Self-pay | Admitting: Family Medicine

## 2019-05-08 ENCOUNTER — Ambulatory Visit (INDEPENDENT_AMBULATORY_CARE_PROVIDER_SITE_OTHER): Payer: Medicare HMO | Admitting: Family Medicine

## 2019-05-08 VITALS — BP 134/58 | HR 69 | Ht 64.0 in | Wt 277.0 lb

## 2019-05-08 DIAGNOSIS — N1831 Chronic kidney disease, stage 3a: Secondary | ICD-10-CM | POA: Diagnosis not present

## 2019-05-08 DIAGNOSIS — R202 Paresthesia of skin: Secondary | ICD-10-CM

## 2019-05-08 DIAGNOSIS — I10 Essential (primary) hypertension: Secondary | ICD-10-CM

## 2019-05-08 NOTE — Assessment & Plan Note (Signed)
Due to recheck renal function in January.  Lab slip provided today.

## 2019-05-08 NOTE — Assessment & Plan Note (Signed)
Well controlled. Continue current regimen. Follow up in  6 mo  

## 2019-05-08 NOTE — Progress Notes (Signed)
Acute Office Visit  Subjective:    Patient ID: Terri Farrell, female    DOB: [birth year], 68 y.o.   MRN: [id number]  Chief Complaint  Patient presents with  . Hypertension  . Allergic Rhinitis     HPI Patient is in today for 6 mo f/u    Hypertension- Pt denies chest pain, SOB, dizziness, or heart palpitations.  Taking meds as directed w/o problems.  Denies medication side effects.    Overall she is doing well she has been mostly staying in and staying safe.  She has had for family members die this year, 3 of which have died from Covid.  They did have underlying health problems.  She did want to let me know that she has been getting some tingling in her legs and feet.  She says she notices it more in the evenings.  It does not happen every day it just occasionally.  Has been going on for a few months.  She has been sitting at her computer a lot more and has been getting some occasional headaches across the top of her head particularly in the evenings and sometimes some neck pain but it is not often.  CKD 3-overall doing well.  We have been following her renal function every 6 months.  Her last creatinine actually look better after she was purposely trying to hydrate more regularly.  She says she still trying to work it drinking more water on a daily basis.  Last serum creatinine was 0.9.   Past Medical History:  Diagnosis Date  . Arthritis    RA  . Depression     Past Surgical History:  Procedure Laterality Date  . ABDOMINAL HYSTERECTOMY  1996   for fibroids  . CARPAL TUNNEL RELEASE     right and left  . CESAREAN SECTION     X 2  . TOTAL KNEE ARTHROPLASTY  08/2010    Family History  Problem Relation Age of Onset  . Diabetes Other        family hx of  . Breast cancer Paternal Aunt     Social History   Socioeconomic History  . Marital status: Widowed    Spouse name: Not on file  . Number of children: 2  . Years of education: 2714  . Highest education level: Some  college, no degree  Occupational History  . Occupation: Location managermachine operator    Comment: retired  Engineer, productionocial Needs  . Financial resource strain: Not hard at all  . Food insecurity    Worry: Never true    Inability: Never true  . Transportation needs    Medical: No    Non-medical: No  Tobacco Use  . Smoking status: Former Smoker    Packs/day: 1.00    Years: 2.00    Pack years: 2.00    Types: Cigarettes  . Smokeless tobacco: Never Used  Substance and Sexual Activity  . Alcohol use: No  . Drug use: No  . Sexual activity: Not Currently  Lifestyle  . Physical activity    Days per week: 0 days    Minutes per session: 0 min  . Stress: Not at all  Relationships  . Social connections    Talks on phone: More than three times a week    Gets together: Never    Attends religious service: More than 4 times per year    Active member of club or organization: No    Attends meetings of clubs or organizations: Never  Relationship status: Widowed  . Intimate partner violence    Fear of current or ex partner: No    Emotionally abused: No    Physically abused: No    Forced sexual activity: No  Other Topics Concern  . Not on file  Social History Narrative   Gets on computer a lot during the day   Cleans house and watches TV   No caffeine use now for 3 weeks    Outpatient Medications Prior to Visit  Medication Sig Dispense Refill  . fluticasone (FLONASE) 50 MCG/ACT nasal spray SPRAY 2 SPRAYS INTO EACH NOSTRIL EVERY DAY 48 g 6  . hydrochlorothiazide (MICROZIDE) 12.5 MG capsule TAKE 1 CAPSULE BY MOUTH EVERY DAY 90 capsule 1  . Vitamin D, Ergocalciferol, (DRISDOL) 1.25 MG (50000 UT) CAPS capsule TAKE 1 CAPSULE (50,000 UNITS TOTAL) BY MOUTH EVERY 7 (SEVEN) DAYS. TAKE FOR 12 TOTAL DOSES(WEEKS) 12 capsule 0   No facility-administered medications prior to visit.     Allergies  Allergen Reactions  . Aspirin Other (See Comments) and Shortness Of Breath    Other reaction(s): Dizziness  .  Lisinopril Other (See Comments)    Fatigue  Other reaction(s): Other Fatigue  . Cymbalta [Duloxetine Hcl] Other (See Comments)    Sedation, inc hunger  . Losartan Other (See Comments)    Weakness and fatigue  . Olmesartan Other (See Comments)    GI upset, chest pain  . Sertraline Other (See Comments)    hallucinations  . Wellbutrin [Bupropion] Other (See Comments)    Patient reports the medication caused her to cry and be tired.     ROS     Objective:    Physical Exam  Constitutional: She is oriented to person, place, and time. She appears well-developed and well-nourished.  HENT:  Head: Normocephalic and atraumatic.  Cardiovascular: Normal rate, regular rhythm and normal heart sounds.  Radials pulse 2+  Pulmonary/Chest: Effort normal and breath sounds normal.  Neurological: She is alert and oriented to person, place, and time.  Skin: Skin is warm and dry.  Psychiatric: She has a normal mood and affect. Her behavior is normal.    BP (!) 134/58   Pulse 69   Ht 5\' 4"  (1.626 m)   Wt 277 lb (125.6 kg)   SpO2 97%   BMI 47.55 kg/m  Wt Readings from Last 3 Encounters:  05/08/19 277 lb (125.6 kg)  11/03/18 280 lb 9.6 oz (127.3 kg)  08/24/18 278 lb (126.1 kg)    Health Maintenance Due  Topic Date Due  . DEXA SCAN  08/29/2017    There are no preventive care reminders to display for this patient.   Lab Results  Component Value Date   TSH 1.86 01/11/2018   Lab Results  Component Value Date   WBC 8.6 01/11/2018   HGB 12.3 01/11/2018   HCT 35.9 01/11/2018   MCV 89.1 01/11/2018   PLT 143 01/11/2018   Lab Results  Component Value Date   NA 141 01/12/2019   K 4.1 01/12/2019   CO2 25 01/12/2019   GLUCOSE 85 01/12/2019   BUN 16 01/12/2019   CREATININE 0.93 01/12/2019   BILITOT 0.5 07/05/2018   ALKPHOS 54 01/27/2017   AST 16 07/05/2018   ALT 11 07/05/2018   PROT 7.7 07/05/2018   ALBUMIN 4.4 01/27/2017   CALCIUM 10.0 01/12/2019   Lab Results  Component  Value Date   CHOL 207 (H) 07/05/2018   Lab Results  Component Value Date  HDL 60 07/05/2018   Lab Results  Component Value Date   LDLCALC 124 (H) 07/05/2018   Lab Results  Component Value Date   TRIG 123 07/05/2018   Lab Results  Component Value Date   CHOLHDL 3.5 07/05/2018   Lab Results  Component Value Date   HGBA1C 5.2 05/24/2015       Assessment & Plan:   Problem List Items Addressed This Visit      Cardiovascular and Mediastinum   Essential hypertension, benign - Primary    Well controlled. Continue current regimen. Follow up in  6 mo.       Relevant Orders   COMPLETE METABOLIC PANEL WITH GFR   CBC   Lipid panel   TSH   B12   Ferritin   Vitamin B1     Genitourinary   Stage 3a chronic kidney disease    Due to recheck renal function in January.  Lab slip provided today.      Relevant Orders   COMPLETE METABOLIC PANEL WITH GFR   CBC   Lipid panel   TSH   B12   Ferritin   Vitamin B1     Other   Paresthesia of both lower extremities   Relevant Orders   COMPLETE METABOLIC PANEL WITH GFR   CBC   Lipid panel   TSH   B12   Ferritin   Vitamin B1     Paresthesias of lower extremities-unclear etiology disc did discuss with her that it may be somewhat positional.  Especially if she is sitting in a certain position at night when it occurs so pay attention to that.  We also discussed that just prolonged sitting can actually cause some nerve impingement the spine which could cause some numbness and tingling in the feet.  We will also check for some mineral deficiencies in January when she goes for her blood work.  Certainly if she feels like it is getting worse, more frequent, or starts to develop any lower extremity weakness then please let me know.  No orders of the defined types were placed in this encounter.    Beatrice Lecher, MD

## 2019-06-03 ENCOUNTER — Other Ambulatory Visit: Payer: Self-pay | Admitting: Family Medicine

## 2019-07-06 ENCOUNTER — Other Ambulatory Visit: Payer: Self-pay | Admitting: Family Medicine

## 2019-07-06 NOTE — Telephone Encounter (Signed)
Needs appointment

## 2019-07-20 ENCOUNTER — Other Ambulatory Visit: Payer: Self-pay | Admitting: Family Medicine

## 2019-07-20 DIAGNOSIS — Z1231 Encounter for screening mammogram for malignant neoplasm of breast: Secondary | ICD-10-CM

## 2019-08-22 NOTE — Progress Notes (Deleted)
Subjective:   Terri Farrell is a 69 y.o. female who presents for Medicare Annual (Subsequent) preventive examination.  Review of Systems:  No ROS.  Medicare Wellness Virtual Visit.  Visual/audio telehealth visit, UTA vital signs.   See social history for additional risk factors.      Sleep patterns:   Home Safety/Smoke Alarms: Feels safe in home. Smoke alarms in place.  Living environment;  Seat Belt Safety/Bike Helmet: Wears seat belt.   Female:   Pap- Aged out       Mammo-  Scheduled for 08/30/19    Dexa scan-  Scheduled for 08/30/19      CCS- UTD     Objective:     Vitals: There were no vitals taken for this visit.  There is no height or weight on file to calculate BMI.  Advanced Directives 08/24/2018  Does Patient Have a Medical Advance Directive? Yes  Type of Estate agent of [place];Living will  Does patient want to make changes to medical advance directive? No - Patient declined  Copy of Healthcare Power of Attorney in Chart? No - copy requested    Tobacco Social History   Tobacco Use  Smoking Status Former Smoker  . Packs/day: 1.00  . Years: 2.00  . Pack years: 2.00  . Types: Cigarettes  Smokeless Tobacco Never Used     Counseling given: Not Answered   Clinical Intake:                       Past Medical History:  Diagnosis Date  . Arthritis    RA  . Depression    Past Surgical History:  Procedure Laterality Date  . ABDOMINAL HYSTERECTOMY  1996   for fibroids  . CARPAL TUNNEL RELEASE     right and left  . CESAREAN SECTION     X 2  . TOTAL KNEE ARTHROPLASTY  08/2010   Family History  Problem Relation Age of Onset  . Diabetes Other        family hx of  . Breast cancer Paternal Aunt    Social History   Socioeconomic History  . Marital status: Widowed    Spouse name: Not on file  . Number of children: 2  . Years of education: 33  . Highest education level: Some college, no degree  Occupational  History  . Occupation: Location manager    Comment: retired  Tobacco Use  . Smoking status: Former Smoker    Packs/day: 1.00    Years: 2.00    Pack years: 2.00    Types: Cigarettes  . Smokeless tobacco: Never Used  Substance and Sexual Activity  . Alcohol use: No  . Drug use: No  . Sexual activity: Not Currently  Other Topics Concern  . Not on file  Social History Narrative   Gets on computer a lot during the day   Cleans house and watches TV   No caffeine use now for 3 weeks   Social Determinants of Health   Financial Resource Strain: Low Risk   . Difficulty of Paying Living Expenses: Not hard at all  Food Insecurity: No Food Insecurity  . Worried About Programme researcher, broadcasting/film/video in the Last Year: Never true  . Ran Out of Food in the Last Year: Never true  Transportation Needs: No Transportation Needs  . Lack of Transportation (Medical): No  . Lack of Transportation (Non-Medical): No  Physical Activity: Inactive  . Days of Exercise  per Week: 0 days  . Minutes of Exercise per Session: 0 min  Stress: No Stress Concern Present  . Feeling of Stress : Not at all  Social Connections: Somewhat Isolated  . Frequency of Communication with Friends and Family: More than three times a week  . Frequency of Social Gatherings with Friends and Family: Never  . Attends Religious Services: More than 4 times per year  . Active Member of Clubs or Organizations: No  . Attends Archivist Meetings: Never  . Marital Status: Widowed    Outpatient Encounter Medications as of 08/28/2019  Medication Sig  . fluticasone (FLONASE) 50 MCG/ACT nasal spray SPRAY 2 SPRAYS INTO EACH NOSTRIL EVERY DAY  . hydrochlorothiazide (MICROZIDE) 12.5 MG capsule Take 1 capsule (12.5 mg total) by mouth daily. MUST MAKE APPOINTMENT  . Vitamin D, Ergocalciferol, (DRISDOL) 1.25 MG (50000 UT) CAPS capsule TAKE 1 CAPSULE (50,000 UNITS TOTAL) BY MOUTH EVERY 7 (SEVEN) DAYS. TAKE FOR 12 TOTAL DOSES(WEEKS)   No  facility-administered encounter medications on file as of 08/28/2019.    Activities of Daily Living In your present state of health, do you have any difficulty performing the following activities: 08/24/2018  Hearing? N  Vision? N  Difficulty concentrating or making decisions? N  Walking or climbing stairs? Y  Comment takes them slow  Dressing or bathing? N  Doing errands, shopping? N  Preparing Food and eating ? N  Using the Toilet? N  In the past six months, have you accidently leaked urine? Y  Comment when needs to go has to [place] and get to the bathroom or will leak  Do you have problems with loss of bowel control? N  Managing your Medications? N  Managing your Finances? N  Housekeeping or managing your Housekeeping? N  Some recent data might be hidden    Patient Care Team: Hali Marry, MD as PCP - General (Family Medicine)    Assessment:   This is a routine wellness examination for Terri Farrell.Physical assessment deferred to PCP.   Exercise Activities and Dietary recommendations   Diet  Breakfast: Lunch:  Dinner:       Goals    . Patient Stated     Would like to have her teeth fixed this year. It is on her list of things to get done. Will make her feel better about herself, insteal confidence    . Weight (lb) < 200 lb (90.7 kg)     Continue to loose weight. Has lost 20lbs so far. Great job       Fall Risk Fall Risk  08/24/2018 07/05/2018 07/01/2017 01/27/2017 11/22/2015  Falls in the past year? 0 0 No No Yes  Number falls in past yr: - 0 - - 1  Injury with Fall? - 0 - - No  Risk for fall due to : Impaired balance/gait;Impaired mobility Impaired mobility;Impaired balance/gait - - Impaired mobility  Follow up Falls prevention discussed Falls prevention discussed - - Falls prevention discussed   Is the patient's home free of loose throw rugs in walkways, pet beds, electrical cords, etc?   {Blank single:19197::"yes","no"}      Grab bars in the bathroom? {Blank  single:19197::"yes","no"}      Handrails on the stairs?   {Blank single:19197::"yes","no"}      Adequate lighting?   {Blank single:19197::"yes","no"}   Depression Screen PHQ 2/9 Scores 11/03/2018 08/24/2018 07/05/2018 03/03/2018  PHQ - 2 Score 0 0 1 1  PHQ- 9 Score - - - 5  Cognitive Function     6CIT Screen 08/24/2018  What Year? 0 points  What month? 0 points  What time? 0 points  Count back from 20 0 points  Months in reverse 0 points  Repeat phrase 2 points  Total Score 2    Immunization History  Administered Date(s) Administered  . Influenza Split 04/11/2012  . Influenza Whole 03/17/2013  . Influenza, High Dose Seasonal PF 03/07/2019  . Influenza,inj,Quad PF,6+ Mos 02/16/2014, 04/01/2017, 03/03/2018, 04/18/2018  . Influenza-Unspecified 04/08/2015, 03/18/2016, 04/01/2017  . Pneumococcal Polysaccharide-23 03/23/2019  . Tdap 06/12/2011    Screening Tests Health Maintenance  Topic Date Due  . DEXA SCAN  08/29/2017  . PNA vac Low Risk Adult (2 of 2 - PCV13) 03/22/2020  . MAMMOGRAM  08/23/2020  . TETANUS/TDAP  06/11/2021  . COLONOSCOPY  02/03/2027  . INFLUENZA VACCINE  Completed  . Hepatitis C Screening  Completed        Plan:   ***   I have personally reviewed and noted the following in the patient's chart:   . Medical and social history . Use of alcohol, tobacco or illicit drugs  . Current medications and supplements . Functional ability and status . Nutritional status . Physical activity . Advanced directives . List of other physicians . Hospitalizations, surgeries, and ER visits in previous 12 months . Vitals . Screenings to include cognitive, depression, and falls . Referrals and appointments  In addition, I have reviewed and discussed with patient certain preventive protocols, quality metrics, and best practice recommendations. A written personalized care plan for preventive services as well as general preventive health recommendations were  provided to patient.     Normand Sloop, LPN  06/16/1973

## 2019-08-24 ENCOUNTER — Other Ambulatory Visit: Payer: Self-pay

## 2019-08-24 ENCOUNTER — Ambulatory Visit (INDEPENDENT_AMBULATORY_CARE_PROVIDER_SITE_OTHER): Payer: Medicare HMO

## 2019-08-24 DIAGNOSIS — N1831 Chronic kidney disease, stage 3a: Secondary | ICD-10-CM | POA: Diagnosis not present

## 2019-08-24 DIAGNOSIS — R202 Paresthesia of skin: Secondary | ICD-10-CM | POA: Diagnosis not present

## 2019-08-24 DIAGNOSIS — Z1231 Encounter for screening mammogram for malignant neoplasm of breast: Secondary | ICD-10-CM | POA: Diagnosis not present

## 2019-08-24 DIAGNOSIS — I10 Essential (primary) hypertension: Secondary | ICD-10-CM | POA: Diagnosis not present

## 2019-08-24 DIAGNOSIS — D508 Other iron deficiency anemias: Secondary | ICD-10-CM | POA: Diagnosis not present

## 2019-08-28 ENCOUNTER — Ambulatory Visit: Payer: Medicare HMO

## 2019-08-30 ENCOUNTER — Other Ambulatory Visit: Payer: Self-pay

## 2019-08-30 ENCOUNTER — Ambulatory Visit (INDEPENDENT_AMBULATORY_CARE_PROVIDER_SITE_OTHER): Payer: Medicare HMO

## 2019-08-30 ENCOUNTER — Ambulatory Visit: Payer: Medicare HMO

## 2019-08-30 DIAGNOSIS — Z0000 Encounter for general adult medical examination without abnormal findings: Secondary | ICD-10-CM

## 2019-08-30 DIAGNOSIS — Z780 Asymptomatic menopausal state: Secondary | ICD-10-CM

## 2019-08-30 DIAGNOSIS — Z13820 Encounter for screening for osteoporosis: Secondary | ICD-10-CM | POA: Diagnosis not present

## 2019-08-30 LAB — CBC
HCT: 40.1 % (ref 35.0–45.0)
Hemoglobin: 13.2 g/dL (ref 11.7–15.5)
MCH: 30.4 pg (ref 27.0–33.0)
MCHC: 32.9 g/dL (ref 32.0–36.0)
MCV: 92.4 fL (ref 80.0–100.0)
MPV: 14.6 fL — ABNORMAL HIGH (ref 7.5–12.5)
Platelets: 128 10*3/uL — ABNORMAL LOW (ref 140–400)
RBC: 4.34 10*6/uL (ref 3.80–5.10)
RDW: 12.4 % (ref 11.0–15.0)
WBC: 7.4 10*3/uL (ref 3.8–10.8)

## 2019-08-30 LAB — COMPLETE METABOLIC PANEL WITH GFR
AG Ratio: 1.2 (calc) (ref 1.0–2.5)
ALT: 16 U/L (ref 6–29)
AST: 20 U/L (ref 10–35)
Albumin: 4.1 g/dL (ref 3.6–5.1)
Alkaline phosphatase (APISO): 57 U/L (ref 37–153)
BUN/Creatinine Ratio: 19 (calc) (ref 6–22)
BUN: 19 mg/dL (ref 7–25)
CO2: 25 mmol/L (ref 20–32)
Calcium: 9.9 mg/dL (ref 8.6–10.4)
Chloride: 101 mmol/L (ref 98–110)
Creat: 1.01 mg/dL — ABNORMAL HIGH (ref 0.50–0.99)
GFR, Est African American: 67 mL/min/{1.73_m2} (ref >=60)
GFR, Est Non African American: 58 mL/min/{1.73_m2} — ABNORMAL LOW (ref >=60)
Globulin: 3.3 g/dL (calc) (ref 1.9–3.7)
Glucose, Bld: 103 mg/dL — ABNORMAL HIGH (ref 65–99)
Potassium: 4 mmol/L (ref 3.5–5.3)
Sodium: 139 mmol/L (ref 135–146)
Total Bilirubin: 0.5 mg/dL (ref 0.2–1.2)
Total Protein: 7.4 g/dL (ref 6.1–8.1)

## 2019-08-30 LAB — LIPID PANEL
Cholesterol: 199 mg/dL (ref <200)
HDL: 61 mg/dL (ref >=50)
LDL Cholesterol (Calc): 116 mg/dL (calc) — ABNORMAL HIGH
Non-HDL Cholesterol (Calc): 138 mg/dL (calc) — ABNORMAL HIGH (ref <130)
Total CHOL/HDL Ratio: 3.3 (calc) (ref <5.0)
Triglycerides: 108 mg/dL (ref <150)

## 2019-08-30 LAB — VITAMIN B12: Vitamin B-12: 564 pg/mL (ref 200–1100)

## 2019-08-30 LAB — FERRITIN: Ferritin: 153 ng/mL (ref 16–288)

## 2019-08-30 LAB — TSH: TSH: 2.58 mIU/L (ref 0.40–4.50)

## 2019-08-30 LAB — VITAMIN B1: Vitamin B1 (Thiamine): <6 nmol/L — ABNORMAL LOW (ref 8–30)

## 2019-09-28 ENCOUNTER — Other Ambulatory Visit: Payer: Self-pay | Admitting: Family Medicine

## 2019-10-17 ENCOUNTER — Other Ambulatory Visit: Payer: Self-pay | Admitting: Family Medicine

## 2019-12-06 ENCOUNTER — Ambulatory Visit (INDEPENDENT_AMBULATORY_CARE_PROVIDER_SITE_OTHER): Payer: Medicare HMO | Admitting: Family Medicine

## 2019-12-06 ENCOUNTER — Encounter: Payer: Self-pay | Admitting: Family Medicine

## 2019-12-06 ENCOUNTER — Other Ambulatory Visit: Payer: Self-pay

## 2019-12-06 VITALS — BP 126/47 | HR 66 | Ht 64.0 in | Wt 275.0 lb

## 2019-12-06 DIAGNOSIS — E519 Thiamine deficiency, unspecified: Secondary | ICD-10-CM

## 2019-12-06 DIAGNOSIS — I10 Essential (primary) hypertension: Secondary | ICD-10-CM

## 2019-12-06 DIAGNOSIS — Z6841 Body Mass Index (BMI) 40.0 and over, adult: Secondary | ICD-10-CM | POA: Diagnosis not present

## 2019-12-06 DIAGNOSIS — F330 Major depressive disorder, recurrent, mild: Secondary | ICD-10-CM | POA: Diagnosis not present

## 2019-12-06 DIAGNOSIS — N1831 Chronic kidney disease, stage 3a: Secondary | ICD-10-CM | POA: Diagnosis not present

## 2019-12-06 DIAGNOSIS — E559 Vitamin D deficiency, unspecified: Secondary | ICD-10-CM

## 2019-12-06 DIAGNOSIS — R21 Rash and other nonspecific skin eruption: Secondary | ICD-10-CM | POA: Diagnosis not present

## 2019-12-06 MED ORDER — PHENTERMINE HCL 15 MG PO CAPS: 15.0000 mg | ORAL_CAPSULE | ORAL | 0 refills | Status: DC

## 2019-12-06 MED ORDER — NYSTATIN 100000 UNIT/GM EX POWD: 1.0000 "application " | Freq: Three times a day (TID) | CUTANEOUS | 1 refills | Status: DC

## 2019-12-06 NOTE — Assessment & Plan Note (Signed)
PHQ-9 score of 8 today.  She used to be on an antidepressant but is no longer and is not interested in restarting.

## 2019-12-06 NOTE — Assessment & Plan Note (Signed)
Last renal function was stable.  Continue to monitor every 6 months.

## 2019-12-06 NOTE — Progress Notes (Signed)
Established Patient Office Visit  Subjective:  Patient ID: Terri Farrell, female    DOB: 1951/04/12  Age: 69 y.o. MRN: [id number]  CC:  Chief Complaint  Patient presents with  . Hypertension    HPI Terri Farrell presents for 6 month   Hypertension- Pt denies chest pain, SOB, dizziness, or heart palpitations.  Taking meds as directed w/o problems.  Denies medication side effects.    Also following up on blood work done in March where she was noted to have a significant vitamin B1 deficiency she did start an over-the-counter supplement and is due to recheck those levels.  Follow-up CKD 3-no recent changes.  Kidney function was stable when she went to the lab in March.  Plan to recheck labs every 6 months.  He also really wants to get more aggressive with trying to lose weight she says she is been trying really hard and has plateaued recently.  She would really like to be able to get her hip and or knee replacement she feels like it would make a big difference in her quality of life.  She tries to stay active around the house but is unable to exercise regularly.  And she does use a cane.  She says if she does too much walking her hip will lock on her.  More recently she is actually been getting some left-sided knee pain.  He also occasionally gets a red itchy rash underneath her breasts and under the pannus line.  She is worried that it may break out again over the summer.  Past Medical History:  Diagnosis Date  . Arthritis    RA  . Depression     Past Surgical History:  Procedure Laterality Date  . ABDOMINAL HYSTERECTOMY  1996   for fibroids  . CARPAL TUNNEL RELEASE     right and left  . CESAREAN SECTION     X 2  . TOTAL KNEE ARTHROPLASTY  08/2010    Family History  Problem Relation Age of Onset  . Diabetes Other        family hx of  . Breast cancer Paternal Aunt     Social History   Socioeconomic History  . Marital status: Widowed    Spouse name: Not on file   . Number of children: 2  . Years of education: 19  . Highest education level: Some college, no degree  Occupational History  . Occupation: Location manager    Comment: retired  Tobacco Use  . Smoking status: Former Smoker    Packs/day: 1.00    Years: 2.00    Pack years: 2.00    Types: Cigarettes  . Smokeless tobacco: Never Used  Vaping Use  . Vaping Use: Never used  Substance and Sexual Activity  . Alcohol use: No  . Drug use: No  . Sexual activity: Not Currently  Other Topics Concern  . Not on file  Social History Narrative   Gets on computer a lot during the day   Cleans house and watches TV   No caffeine use now for 3 weeks   Social Determinants of Health   Financial Resource Strain:   . Difficulty of Paying Living Expenses:   Food Insecurity:   . Worried About Programme researcher, broadcasting/film/video in the Last Year:   . Barista in the Last Year:   Transportation Needs:   . Freight forwarder (Medical):   Marland Kitchen Lack of Transportation (Non-Medical):   Physical Activity:   .  Days of Exercise per Week:   . Minutes of Exercise per Session:   Stress:   . Feeling of Stress :   Social Connections:   . Frequency of Communication with Friends and Family:   . Frequency of Social Gatherings with Friends and Family:   . Attends Religious Services:   . Active Member of Clubs or Organizations:   . Attends Archivist Meetings:   Marland Kitchen Marital Status:   Intimate Partner Violence:   . Fear of Current or Ex-Partner:   . Emotionally Abused:   Marland Kitchen Physically Abused:   . Sexually Abused:     Outpatient Medications Prior to Visit  Medication Sig Dispense Refill  . fluticasone (FLONASE) 50 MCG/ACT nasal spray SPRAY 2 SPRAYS INTO EACH NOSTRIL EVERY DAY 48 mL 6  . hydrochlorothiazide (MICROZIDE) 12.5 MG capsule Take 1 capsule (12.5 mg total) by mouth daily. 90 capsule 1  . Thiamine HCl (VITAMIN B1 PO) Take 1 tablet by mouth daily.    . Vitamin D, Ergocalciferol, (DRISDOL) 1.25 MG  (50000 UNIT) CAPS capsule Take 50,000 Units by mouth every 7 (seven) days.    . Vitamin D, Ergocalciferol, (DRISDOL) 1.25 MG (50000 UT) CAPS capsule TAKE 1 CAPSULE (50,000 UNITS TOTAL) BY MOUTH EVERY 7 (SEVEN) DAYS. TAKE FOR 12 TOTAL DOSES(WEEKS) 12 capsule 0   No facility-administered medications prior to visit.    Allergies  Allergen Reactions  . Aspirin Other (See Comments) and Shortness Of Breath    Other reaction(s): Dizziness  . Lisinopril Other (See Comments)    Fatigue  Other reaction(s): Other Fatigue  . Cymbalta [Duloxetine Hcl] Other (See Comments)    Sedation, inc hunger  . Losartan Other (See Comments)    Weakness and fatigue  . Olmesartan Other (See Comments)    GI upset, chest pain  . Sertraline Other (See Comments)    hallucinations  . Wellbutrin [Bupropion] Other (See Comments)    Patient reports the medication caused her to cry and be tired.     ROS Review of Systems    Objective:    Physical Exam Constitutional:      Appearance: She is well-developed.  HENT:     Head: Normocephalic and atraumatic.  Cardiovascular:     Rate and Rhythm: Normal rate and regular rhythm.     Heart sounds: Normal heart sounds.  Pulmonary:     Effort: Pulmonary effort is normal.     Breath sounds: Normal breath sounds.  Skin:    General: Skin is warm and dry.  Neurological:     Mental Status: She is alert and oriented to person, place, and time.  Psychiatric:        Behavior: Behavior normal.     BP (!) 126/47   Pulse 66   Ht 5\' 4"  (1.626 m)   Wt 275 lb (124.7 kg)   SpO2 96%   BMI 47.20 kg/m  Wt Readings from Last 3 Encounters:  12/06/19 275 lb (124.7 kg)  05/08/19 277 lb (125.6 kg)  11/03/18 280 lb 9.6 oz (127.3 kg)     There are no preventive care reminders to display for this patient.  There are no preventive care reminders to display for this patient.  Lab Results  Component Value Date   TSH 2.58 08/24/2019   Lab Results  Component Value Date    WBC 7.4 08/24/2019   HGB 13.2 08/24/2019   HCT 40.1 08/24/2019   MCV 92.4 08/24/2019   PLT 128 (L) 08/24/2019  Lab Results  Component Value Date   NA 139 08/24/2019   K 4.0 08/24/2019   CO2 25 08/24/2019   GLUCOSE 103 (H) 08/24/2019   BUN 19 08/24/2019   CREATININE 1.01 (H) 08/24/2019   BILITOT 0.5 08/24/2019   ALKPHOS 54 01/27/2017   AST 20 08/24/2019   ALT 16 08/24/2019   PROT 7.4 08/24/2019   ALBUMIN 4.4 01/27/2017   CALCIUM 9.9 08/24/2019   Lab Results  Component Value Date   CHOL 199 08/24/2019   Lab Results  Component Value Date   HDL 61 08/24/2019   Lab Results  Component Value Date   LDLCALC 116 (H) 08/24/2019   Lab Results  Component Value Date   TRIG 108 08/24/2019   Lab Results  Component Value Date   CHOLHDL 3.3 08/24/2019   Lab Results  Component Value Date   HGBA1C 5.2 05/24/2015      Assessment & Plan:   Problem List Items Addressed This Visit      Cardiovascular and Mediastinum   Essential hypertension, benign - Primary    Well controlled. Continue current regimen. Follow up in  6 mo        Genitourinary   Stage 3a chronic kidney disease    Last renal function was stable.  Continue to monitor every 6 months.        Other   Vitamin D deficiency   Depression, major, recurrent (HCC)    PHQ-9 score of 8 today.  She used to be on an antidepressant but is no longer and is not interested in restarting.       Other Visit Diagnoses    Thiamin deficiency       Relevant Orders   Vitamin B1   BMI 45.0-49.9, adult (HCC)       Relevant Medications   phentermine 15 MG capsule   Rash        Skin candidiasis -She is not actually broken out today but suspect skin candidiasis will send over prescription for nystatin powder to the local pharmacy.  Meds ordered this encounter  Medications  . phentermine 15 MG capsule    Sig: Take 1 capsule (15 mg total) by mouth every morning.    Dispense:  30 capsule    Refill:  0  . nystatin  (MYCOSTATIN/NYSTOP) powder    Sig: Apply 1 application topically 3 (three) times daily.    Dispense:  60 g    Refill:  1    Follow-up: Return in about 4 weeks (around 01/03/2020) for weight follow up. Nani Gasser, MD

## 2019-12-06 NOTE — Assessment & Plan Note (Signed)
Well controlled. Continue current regimen. Follow up in  6 mo  

## 2019-12-11 LAB — VITAMIN B1: Vitamin B1 (Thiamine): 36 nmol/L — ABNORMAL HIGH (ref 8–30)

## 2019-12-11 NOTE — Progress Notes (Signed)
How much B1 is she taking. Its a little high now.

## 2019-12-12 NOTE — Progress Notes (Signed)
Decrease to 75mg  (3 25mg  tablets of b1) and recheck in 3 months.

## 2019-12-13 ENCOUNTER — Other Ambulatory Visit: Payer: Self-pay | Admitting: Physician Assistant

## 2019-12-13 DIAGNOSIS — R7989 Other specified abnormal findings of blood chemistry: Secondary | ICD-10-CM

## 2019-12-13 DIAGNOSIS — R748 Abnormal levels of other serum enzymes: Secondary | ICD-10-CM

## 2019-12-13 NOTE — Progress Notes (Signed)
"  Decrease to 75mg  (3 25mg  tablets of b1) and recheck in 3 months. "  Orders placed

## 2020-01-03 ENCOUNTER — Encounter: Payer: Self-pay | Admitting: Family Medicine

## 2020-01-03 ENCOUNTER — Other Ambulatory Visit: Payer: Self-pay

## 2020-01-03 ENCOUNTER — Ambulatory Visit (INDEPENDENT_AMBULATORY_CARE_PROVIDER_SITE_OTHER): Payer: Medicare HMO | Admitting: Family Medicine

## 2020-01-03 VITALS — BP 125/50 | HR 75 | Ht 64.0 in | Wt 268.0 lb

## 2020-01-03 DIAGNOSIS — R635 Abnormal weight gain: Secondary | ICD-10-CM | POA: Diagnosis not present

## 2020-01-03 DIAGNOSIS — Z6841 Body Mass Index (BMI) 40.0 and over, adult: Secondary | ICD-10-CM | POA: Diagnosis not present

## 2020-01-03 MED ORDER — PHENTERMINE HCL 15 MG PO CAPS: 15.0000 mg | ORAL_CAPSULE | ORAL | 0 refills | Status: DC

## 2020-01-03 NOTE — Progress Notes (Signed)
Established Patient Office Visit  Subjective:  Patient ID: Terri Farrell, female    DOB: [birth year]  Age: 69 y.o. MRN: [id number]  CC:  Chief Complaint  Patient presents with  . Weight Check    HPI Terri Farrell presents for 4-week follow-up for weight loss.  We started her on 50 mg of phentermine daily.  She is been tolerating it well without any significant side effects or problems.  No chest pain shortness breath palpitations. She is down 7 lbs.    she is actually doing really well she has not had any side effects such as palpitations, chest pain, insomnia etc.  She has not felt jittery on the medication she said she did feel nauseated the first couple days that she started she has been taking it right after breakfast in the mornings.  She actually did not start the medication until July 3.  Thiamine deficiency-we recently rechecked her labs and her level was actually too elevated.  She said she had been taking a thiamine supplement in addition to a B complex.  She has now stopped the B complex and is just taking the thiamine by itself.  Past Medical History:  Diagnosis Date  . Arthritis    RA  . Depression     Past Surgical History:  Procedure Laterality Date  . ABDOMINAL HYSTERECTOMY  1996   for fibroids  . CARPAL TUNNEL RELEASE     right and left  . CESAREAN SECTION     X 2  . TOTAL KNEE ARTHROPLASTY  08/2010    Family History  Problem Relation Age of Onset  . Diabetes Other        family hx of  . Breast cancer Paternal Aunt     Social History   Socioeconomic History  . Marital status: Widowed    Spouse name: Not on file  . Number of children: 2  . Years of education: 32  . Highest education level: Some college, no degree  Occupational History  . Occupation: Location manager    Comment: retired  Tobacco Use  . Smoking status: Former Smoker    Packs/day: 1.00    Years: 2.00    Pack years: 2.00    Types: Cigarettes  . Smokeless tobacco: Never Used   Vaping Use  . Vaping Use: Never used  Substance and Sexual Activity  . Alcohol use: No  . Drug use: No  . Sexual activity: Not Currently  Other Topics Concern  . Not on file  Social History Narrative   Gets on computer a lot during the day   Cleans house and watches TV   No caffeine use now for 3 weeks   Social Determinants of Health   Financial Resource Strain:   . Difficulty of Paying Living Expenses:   Food Insecurity:   . Worried About Programme researcher, broadcasting/film/video in the Last Year:   . Barista in the Last Year:   Transportation Needs:   . Freight forwarder (Medical):   Marland Kitchen Lack of Transportation (Non-Medical):   Physical Activity:   . Days of Exercise per Week:   . Minutes of Exercise per Session:   Stress:   . Feeling of Stress :   Social Connections:   . Frequency of Communication with Friends and Family:   . Frequency of Social Gatherings with Friends and Family:   . Attends Religious Services:   . Active Member of Clubs or Organizations:   .  Attends Banker Meetings:   Marland Kitchen Marital Status:   Intimate Partner Violence:   . Fear of Current or Ex-Partner:   . Emotionally Abused:   Marland Kitchen Physically Abused:   . Sexually Abused:     Outpatient Medications Prior to Visit  Medication Sig Dispense Refill  . fluticasone (FLONASE) 50 MCG/ACT nasal spray SPRAY 2 SPRAYS INTO EACH NOSTRIL EVERY DAY 48 mL 6  . hydrochlorothiazide (MICROZIDE) 12.5 MG capsule Take 1 capsule (12.5 mg total) by mouth daily. 90 capsule 1  . nystatin (MYCOSTATIN/NYSTOP) powder Apply 1 application topically 3 (three) times daily. 60 g 1  . Thiamine HCl (VITAMIN B1 PO) Take 1 tablet by mouth daily.    . Vitamin D, Ergocalciferol, (DRISDOL) 1.25 MG (50000 UNIT) CAPS capsule Take 50,000 Units by mouth every 7 (seven) days.    . phentermine 15 MG capsule Take 1 capsule (15 mg total) by mouth every morning. 30 capsule 0   No facility-administered medications prior to visit.    Allergies   Allergen Reactions  . Aspirin Other (See Comments) and Shortness Of Breath    Other reaction(s): Dizziness  . Lisinopril Other (See Comments)    Fatigue  Other reaction(s): Other Fatigue  . Cymbalta [Duloxetine Hcl] Other (See Comments)    Sedation, inc hunger  . Losartan Other (See Comments)    Weakness and fatigue  . Olmesartan Other (See Comments)    GI upset, chest pain  . Sertraline Other (See Comments)    hallucinations  . Wellbutrin [Bupropion] Other (See Comments)    Patient reports the medication caused her to cry and be tired.     ROS Review of Systems    Objective:    Physical Exam Constitutional:      Appearance: She is well-developed.  HENT:     Head: Normocephalic and atraumatic.  Cardiovascular:     Rate and Rhythm: Normal rate and regular rhythm.     Heart sounds: Normal heart sounds.  Pulmonary:     Effort: Pulmonary effort is normal.     Breath sounds: Normal breath sounds.  Skin:    General: Skin is warm and dry.  Neurological:     Mental Status: She is alert and oriented to person, place, and time.  Psychiatric:        Behavior: Behavior normal.     BP (!) 125/50   Pulse 75   Ht 5\' 4"  (1.626 m)   Wt 268 lb (121.6 kg)   SpO2 97%   BMI 46.00 kg/m  Wt Readings from Last 3 Encounters:  01/03/20 268 lb (121.6 kg)  12/06/19 275 lb (124.7 kg)  05/08/19 277 lb (125.6 kg)     There are no preventive care reminders to display for this patient.  There are no preventive care reminders to display for this patient.  Lab Results  Component Value Date   TSH 2.58 08/24/2019   Lab Results  Component Value Date   WBC 7.4 08/24/2019   HGB 13.2 08/24/2019   HCT 40.1 08/24/2019   MCV 92.4 08/24/2019   PLT 128 (L) 08/24/2019   Lab Results  Component Value Date   NA 139 08/24/2019   K 4.0 08/24/2019   CO2 25 08/24/2019   GLUCOSE 103 (H) 08/24/2019   BUN 19 08/24/2019   CREATININE 1.01 (H) 08/24/2019   BILITOT 0.5 08/24/2019   ALKPHOS  54 01/27/2017   AST 20 08/24/2019   ALT 16 08/24/2019   PROT 7.4 08/24/2019  ALBUMIN 4.4 01/27/2017   CALCIUM 9.9 08/24/2019   Lab Results  Component Value Date   CHOL 199 08/24/2019   Lab Results  Component Value Date   HDL 61 08/24/2019   Lab Results  Component Value Date   LDLCALC 116 (H) 08/24/2019   Lab Results  Component Value Date   TRIG 108 08/24/2019   Lab Results  Component Value Date   CHOLHDL 3.3 08/24/2019   Lab Results  Component Value Date   HGBA1C 5.2 05/24/2015      Assessment & Plan:   Problem List Items Addressed This Visit      Other   BMI 45.0-49.9, adult (HCC) - Primary    She is doing really well so far down 7 pounds.  We discussed setting some goals each month that she really wants to work on.  We will continue with the medication since she is asymptomatic and blood pressures are still well controlled.  Heart sounds good today.  Previous weight: 275 pounds Current weight: 268 pounds Change in weight: Down 7 pounds Nutrition goal: Increase protein intake since she does not eat meat.  She is already cut back on her caffeine intake and drinks mostly water. Exercise goal: She wants to start walking more.  She is active around the house. Medication: Phentermine 15 mg daily. Follow-up: 1 month nurse visit.      Relevant Medications   phentermine 15 MG capsule (Start on 01/14/2020)    Other Visit Diagnoses    Abnormal weight gain         Vitamin deficiency-we will plan to recheck level in 3 months she has stopped one of the 2 supplements she was taking.  Meds ordered this encounter  Medications  . phentermine 15 MG capsule    Sig: Take 1 capsule (15 mg total) by mouth every morning.    Dispense:  30 capsule    Refill:  0    Follow-up: Return in about 4 weeks (around 01/31/2020) for Nurse visit for weight check .    Nani Gasser, MD

## 2020-01-03 NOTE — Progress Notes (Signed)
Patient doing well on appetite suppressant.  Denies problems with insomnia, heart palpitations or tremors. Satisfied with weight loss thus far and is working on healthy diet and regular exercise.   

## 2020-01-03 NOTE — Assessment & Plan Note (Signed)
She is doing really well so far down 7 pounds.  We discussed setting some goals each month that she really wants to work on.  We will continue with the medication since she is asymptomatic and blood pressures are still well controlled.  Heart sounds good today.  Previous weight: 275 pounds Current weight: 268 pounds Change in weight: Down 7 pounds Nutrition goal: Increase protein intake since she does not eat meat.  She is already cut back on her caffeine intake and drinks mostly water. Exercise goal: She wants to start walking more.  She is active around the house. Medication: Phentermine 15 mg daily. Follow-up: 1 month nurse visit.

## 2020-01-17 ENCOUNTER — Telehealth: Payer: Self-pay

## 2020-01-17 NOTE — Telephone Encounter (Signed)
Terri Farrell, it is a little bit unusual especially if she is already been on it for a couple of months to suddenly have side effects if anything the side effects are usually present at the beginning.  She can stop it for couple of days and see how she feels.  She can always restart it if she is feeling better or even start back at a half a tab if she would prefer.

## 2020-01-17 NOTE — Telephone Encounter (Signed)
Patient called stating she has been on phentermine for about 8 weeks now but for the last week, she has been developing a head ache and feeling weak and tired after taking it. Wakes up feeling fine, then SX start after she takes it between 7:45 and 8:00 every morning. Patient states she had been told she had to wean off of this medication, which is why she has not stopped taking it. Patient wanting to know if she can held med for 2 days and then see if she feels better?

## 2020-01-18 NOTE — Telephone Encounter (Signed)
Patient will hold medication for couple days and call back with update

## 2020-02-07 ENCOUNTER — Ambulatory Visit (INDEPENDENT_AMBULATORY_CARE_PROVIDER_SITE_OTHER): Payer: Medicare HMO | Admitting: Family Medicine

## 2020-02-07 VITALS — BP 126/65 | HR 68 | Ht 64.0 in | Wt 265.1 lb

## 2020-02-07 DIAGNOSIS — R635 Abnormal weight gain: Secondary | ICD-10-CM | POA: Diagnosis not present

## 2020-02-07 NOTE — Progress Notes (Signed)
Abnormal weight gain - she is doing well without the phentermine. F/U in 1 months.   Agree with documentation as above.   Nani Gasser, MD

## 2020-02-07 NOTE — Progress Notes (Signed)
Pt is here for weight check. Currently denies trouble sleeping or palpitations.   Pt states she stopped taking Phentermine x2 weeks prior due to nausea and tinnitus. She was afraid of restarting the medication after 2 days as advised per Dr. Linford Arnold.  Pt has lost 3lbs.

## 2020-03-06 ENCOUNTER — Ambulatory Visit: Payer: Medicare HMO

## 2020-04-07 ENCOUNTER — Other Ambulatory Visit: Payer: Self-pay | Admitting: Family Medicine

## 2020-07-24 ENCOUNTER — Other Ambulatory Visit: Payer: Self-pay | Admitting: Family Medicine

## 2020-07-24 DIAGNOSIS — Z1231 Encounter for screening mammogram for malignant neoplasm of breast: Secondary | ICD-10-CM

## 2020-08-28 ENCOUNTER — Ambulatory Visit (INDEPENDENT_AMBULATORY_CARE_PROVIDER_SITE_OTHER): Payer: Medicare HMO | Admitting: Family Medicine

## 2020-08-28 ENCOUNTER — Encounter: Payer: Self-pay | Admitting: Family Medicine

## 2020-08-28 ENCOUNTER — Ambulatory Visit (INDEPENDENT_AMBULATORY_CARE_PROVIDER_SITE_OTHER): Payer: Medicare HMO

## 2020-08-28 ENCOUNTER — Other Ambulatory Visit: Payer: Self-pay

## 2020-08-28 VITALS — BP 118/62 | HR 70 | Temp 98.9°F | Resp 20 | Ht 64.0 in | Wt 266.0 lb

## 2020-08-28 DIAGNOSIS — I10 Essential (primary) hypertension: Secondary | ICD-10-CM

## 2020-08-28 DIAGNOSIS — Z1231 Encounter for screening mammogram for malignant neoplasm of breast: Secondary | ICD-10-CM

## 2020-08-28 DIAGNOSIS — Z6841 Body Mass Index (BMI) 40.0 and over, adult: Secondary | ICD-10-CM | POA: Diagnosis not present

## 2020-08-28 DIAGNOSIS — R7309 Other abnormal glucose: Secondary | ICD-10-CM | POA: Diagnosis not present

## 2020-08-28 DIAGNOSIS — J301 Allergic rhinitis due to pollen: Secondary | ICD-10-CM | POA: Diagnosis not present

## 2020-08-28 DIAGNOSIS — N1831 Chronic kidney disease, stage 3a: Secondary | ICD-10-CM

## 2020-08-28 LAB — COMPLETE METABOLIC PANEL WITH GFR
AST: 19 U/L (ref 10–35)
CO2: 22 mmol/L (ref 20–32)
Creat: 0.81 mg/dL (ref 0.50–0.99)
Sodium: 136 mmol/L (ref 135–146)

## 2020-08-28 NOTE — Assessment & Plan Note (Signed)
Well controlled. Continue current regimen. Follow up in  6 mo  

## 2020-08-28 NOTE — Assessment & Plan Note (Signed)
To recheck renal function.  Last creatinine was 1.0.  Its been a year since we have done blood work so definitely want a get that updated.

## 2020-08-28 NOTE — Progress Notes (Signed)
Established Patient Office Visit  Subjective:  Patient ID: Terri Farrell, female    DOB: 07/18/1950  Age: 70 y.o. MRN: [id number]  CC: No chief complaint on file.   HPI Terri Farrell presents for   Hypertension- Pt denies chest pain, SOB, dizziness, or heart palpitations.  Taking meds as directed w/o problems.  Denies medication side effects.    F/U CKD 3 -no recent changes.  Sinus sxs -feels like she has been dealing with some seasonal allergies for the last couple of weeks she is mostly been congested and had some upper headache and facial pressure.  She did start the fluticasone.  She does get allergies around this time a year each year.  She also reports that about 3 weeks ago her sciatica on the right side was really bothering her she was limping around and having difficulty even getting in and out of bed but says it has gradually gotten better she found some exercises on the Internet and started working on those.  BMI 45-she says around the holidays she knows she ate too much but feels like she is back on track with her diet.  Past Medical History:  Diagnosis Date  . Arthritis    RA  . Depression     Past Surgical History:  Procedure Laterality Date  . ABDOMINAL HYSTERECTOMY  1996   for fibroids  . CARPAL TUNNEL RELEASE     right and left  . CESAREAN SECTION     X 2  . TOTAL KNEE ARTHROPLASTY  08/2010    Family History  Problem Relation Age of Onset  . Diabetes Other        family hx of  . Breast cancer Paternal Aunt     Social History   Socioeconomic History  . Marital status: Widowed    Spouse name: Not on file  . Number of children: 2  . Years of education: 64  . Highest education level: Some college, no degree  Occupational History  . Occupation: Location manager    Comment: retired  Tobacco Use  . Smoking status: Former Smoker    Packs/day: 1.00    Years: 2.00    Pack years: 2.00    Types: Cigarettes  . Smokeless tobacco: Never Used   Vaping Use  . Vaping Use: Never used  Substance and Sexual Activity  . Alcohol use: No  . Drug use: No  . Sexual activity: Not Currently  Other Topics Concern  . Not on file  Social History Narrative   Gets on computer a lot during the day   Cleans house and watches TV   No caffeine use now for 3 weeks   Social Determinants of Health   Financial Resource Strain: Not on file  Food Insecurity: Not on file  Transportation Needs: Not on file  Physical Activity: Not on file  Stress: Not on file  Social Connections: Not on file  Intimate Partner Violence: Not on file    Outpatient Medications Prior to Visit  Medication Sig Dispense Refill  . fluticasone (FLONASE) 50 MCG/ACT nasal spray SPRAY 2 SPRAYS INTO EACH NOSTRIL EVERY DAY 48 mL 6  . hydrochlorothiazide (MICROZIDE) 12.5 MG capsule TAKE 1 CAPSULE BY MOUTH EVERY DAY 90 capsule 1  . nystatin (MYCOSTATIN/NYSTOP) powder Apply 1 application topically 3 (three) times daily. 60 g 1  . Thiamine HCl (VITAMIN B1 PO) Take 1 tablet by mouth daily.    . Vitamin D, Ergocalciferol, (DRISDOL) 1.25 MG (50000 UNIT)  CAPS capsule Take 50,000 Units by mouth every 7 (seven) days.    . phentermine 15 MG capsule Take 1 capsule (15 mg total) by mouth every morning. (Patient not taking: No sig reported) 30 capsule 0   No facility-administered medications prior to visit.    Allergies  Allergen Reactions  . Aspirin Other (See Comments) and Shortness Of Breath    Other reaction(s): Dizziness  . Lisinopril Other (See Comments)    Fatigue  Other reaction(s): Other Fatigue  . Cymbalta [Duloxetine Hcl] Other (See Comments)    Sedation, inc hunger  . Losartan Other (See Comments)    Weakness and fatigue  . Olmesartan Other (See Comments)    GI upset, chest pain  . Sertraline Other (See Comments)    hallucinations  . Wellbutrin [Bupropion] Other (See Comments)    Patient reports the medication caused her to cry and be tired.     ROS Review of  Systems    Objective:    Physical Exam Constitutional:      Appearance: She is well-developed.  HENT:     Head: Normocephalic and atraumatic.  Cardiovascular:     Rate and Rhythm: Normal rate and regular rhythm.     Heart sounds: Normal heart sounds.  Pulmonary:     Effort: Pulmonary effort is normal.     Breath sounds: Normal breath sounds.  Skin:    General: Skin is warm and dry.  Neurological:     Mental Status: She is alert and oriented to person, place, and time.  Psychiatric:        Behavior: Behavior normal.     BP 118/62 (BP Location: Left Arm, Patient Position: Sitting, Cuff Size: Large)   Pulse 70   Temp 98.9 F (37.2 C) (Oral)   Resp 20   Ht 5\' 4"  (1.626 m)   Wt 266 lb (120.7 kg)   SpO2 99%   BMI 45.66 kg/m  Wt Readings from Last 3 Encounters:  08/28/20 266 lb (120.7 kg)  02/07/20 265 lb 1.9 oz (120.3 kg)  01/03/20 268 lb (121.6 kg)     Health Maintenance Due  Topic Date Due  . PNA vac Low Risk Adult (2 of 2 - PCV13) 03/22/2020    There are no preventive care reminders to display for this patient.  Lab Results  Component Value Date   TSH 2.58 08/24/2019   Lab Results  Component Value Date   WBC 7.4 08/24/2019   HGB 13.2 08/24/2019   HCT 40.1 08/24/2019   MCV 92.4 08/24/2019   PLT 128 (L) 08/24/2019   Lab Results  Component Value Date   NA 139 08/24/2019   K 4.0 08/24/2019   CO2 25 08/24/2019   GLUCOSE 103 (H) 08/24/2019   BUN 19 08/24/2019   CREATININE 1.01 (H) 08/24/2019   BILITOT 0.5 08/24/2019   ALKPHOS 54 01/27/2017   AST 20 08/24/2019   ALT 16 08/24/2019   PROT 7.4 08/24/2019   ALBUMIN 4.4 01/27/2017   CALCIUM 9.9 08/24/2019   Lab Results  Component Value Date   CHOL 199 08/24/2019   Lab Results  Component Value Date   HDL 61 08/24/2019   Lab Results  Component Value Date   LDLCALC 116 (H) 08/24/2019   Lab Results  Component Value Date   TRIG 108 08/24/2019   Lab Results  Component Value Date   CHOLHDL 3.3  08/24/2019   Lab Results  Component Value Date   HGBA1C 5.2 05/24/2015  Assessment & Plan:   Problem List Items Addressed This Visit      Cardiovascular and Mediastinum   Essential hypertension, benign - Primary    Well controlled. Continue current regimen. Follow up in  78mo      Relevant Orders   COMPLETE METABOLIC PANEL WITH GFR   Lipid panel   Hemoglobin A1c     Genitourinary   Stage 3a chronic kidney disease (HCC)    To recheck renal function.  Last creatinine was 1.0.  Its been a year since we have done blood work so definitely want a get that updated.      Relevant Orders   COMPLETE METABOLIC PANEL WITH GFR   Lipid panel   Hemoglobin A1c     Other   BMI 45.0-49.9, adult (HCC)    Other Visit Diagnoses    Non-seasonal allergic rhinitis due to pollen         Rhinitis, allergic-continue with nasal steroid spray can add an oral antihistamine if needed.  If not improving over the next several days then consider treatment for acute sinusitis.  Reports that she just had her mammogram done today.  No orders of the defined types were placed in this encounter.   Follow-up: Return in about 6 months (around 02/28/2021) for Hypertension.    Nani Gasser, MD

## 2020-08-28 NOTE — Patient Instructions (Signed)
HAPPY EARLY BIRTHDAY!!!

## 2020-08-29 LAB — LIPID PANEL
Cholesterol: 194 mg/dL (ref <200)
HDL: 68 mg/dL (ref >=50)
LDL Cholesterol (Calc): 108 mg/dL (calc) — ABNORMAL HIGH
Non-HDL Cholesterol (Calc): 126 mg/dL (calc) (ref <130)
Total CHOL/HDL Ratio: 2.9 (calc) (ref <5.0)
Triglycerides: 90 mg/dL (ref <150)

## 2020-08-29 LAB — COMPLETE METABOLIC PANEL WITH GFR
AG Ratio: 1.6 (calc) (ref 1.0–2.5)
ALT: 15 U/L (ref 6–29)
Albumin: 4.4 g/dL (ref 3.6–5.1)
Alkaline phosphatase (APISO): 61 U/L (ref 37–153)
BUN: 16 mg/dL (ref 7–25)
Calcium: 9.9 mg/dL (ref 8.6–10.4)
Chloride: 102 mmol/L (ref 98–110)
GFR, Est African American: 86 mL/min/{1.73_m2} (ref >=60)
GFR, Est Non African American: 75 mL/min/{1.73_m2} (ref >=60)
Globulin: 2.7 g/dL (calc) (ref 1.9–3.7)
Glucose, Bld: 94 mg/dL (ref 65–99)
Potassium: 4.6 mmol/L (ref 3.5–5.3)
Total Bilirubin: 0.5 mg/dL (ref 0.2–1.2)
Total Protein: 7.1 g/dL (ref 6.1–8.1)

## 2020-08-29 LAB — HEMOGLOBIN A1C
Hgb A1c MFr Bld: 5.3 % of total Hgb (ref <5.7)
Mean Plasma Glucose: 105 mg/dL
eAG (mmol/L): 5.8 mmol/L

## 2020-09-05 ENCOUNTER — Other Ambulatory Visit: Payer: Self-pay | Admitting: Family Medicine

## 2020-10-03 ENCOUNTER — Other Ambulatory Visit: Payer: Self-pay | Admitting: Family Medicine

## 2020-10-17 ENCOUNTER — Ambulatory Visit (INDEPENDENT_AMBULATORY_CARE_PROVIDER_SITE_OTHER): Payer: Medicare HMO | Admitting: Family Medicine

## 2020-10-17 DIAGNOSIS — Z0000 Encounter for general adult medical examination without abnormal findings: Secondary | ICD-10-CM

## 2020-10-17 NOTE — Progress Notes (Signed)
MEDICARE ANNUAL WELLNESS VISIT  10/17/2020  Telephone Visit Disclaimer This Medicare AWV was conducted by telephone due to national recommendations for restrictions regarding the COVID-19 Pandemic (e.g. social distancing).  I verified, using two identifiers, that I am speaking with Terri Farrell or their authorized healthcare agent. I discussed the limitations, risks, security, and privacy concerns of performing an evaluation and management service by telephone and the potential availability of an in-person appointment in the future. The patient expressed understanding and agreed to proceed.  Location of Patient: Home Location of Provider (nurse):  In the office.  Subjective:    Terri Farrell is a 70 y.o. female patient of Metheney, Barbarann Ehlers, MD who had a Medicare Annual Wellness Visit today via telephone. Meliana is Retired and lives alone. she has 2 children. she reports that she is socially active and does interact with friends/family regularly. she is minimally physically active and enjoys listening to music and watching t.v.  Patient Care Team: Agapito Games, MD as PCP - General (Family Medicine)  Advanced Directives 10/17/2020 08/24/2018  Does Patient Have a Medical Advance Directive? No Yes  Type of Advance Directive - Healthcare Power of [place];Living will  Does patient want to make changes to medical advance directive? - No - Patient declined  Copy of Healthcare Power of Attorney in Chart? - No - copy requested  Would patient like information on creating a medical advance directive? No - Patient declined [facility] Utilization Over the Past 12 Months: # of hospitalizations or ER visits: 0 # of surgeries: 0  Review of Systems    Patient reports that her overall health is better compared to last year.  History obtained from chart review and the patient  Patient Reported Readings (BP, Pulse, CBG, Weight, etc) none  Pain Assessment Pain : No/denies  pain     Current Medications & Allergies (verified) Allergies as of 10/17/2020      Reactions   Aspirin Other (See Comments), Shortness Of Breath   Other reaction(s): Dizziness   Lisinopril Other (See Comments)   Fatigue Other reaction(s): Other Fatigue   Cymbalta [duloxetine Hcl] Other (See Comments)   Sedation, inc hunger   Losartan Other (See Comments)   Weakness and fatigue   Olmesartan Other (See Comments)   GI upset, chest pain   Sertraline Other (See Comments)   hallucinations   Wellbutrin [bupropion] Other (See Comments)   Patient reports the medication caused her to cry and be tired.       Medication List       Accurate as of Oct 17, 2020 11:23 AM. If you have any questions, ask your nurse or doctor.        fluticasone 50 MCG/ACT nasal spray Commonly known as: FLONASE SPRAY 2 SPRAYS INTO EACH NOSTRIL EVERY DAY   hydrochlorothiazide 12.5 MG capsule Commonly known as: MICROZIDE TAKE 1 CAPSULE BY MOUTH EVERY DAY   nystatin powder Commonly known as: MYCOSTATIN/NYSTOP Apply 1 application topically 3 (three) times daily.   VITAMIN B1 PO Take 1 tablet by mouth daily.   Vitamin D (Ergocalciferol) 1.25 MG (50000 UNIT) Caps capsule Commonly known as: DRISDOL Take 50,000 Units by mouth every 7 (seven) days.       History (reviewed): Past Medical History:  Diagnosis Date  . Arthritis    RA  . Depression    Past Surgical History:  Procedure Laterality Date  . ABDOMINAL HYSTERECTOMY  1996   for fibroids  .  CARPAL TUNNEL RELEASE     right and left  . CESAREAN SECTION     X 2  . TOTAL KNEE ARTHROPLASTY  08/2010   Family History  Problem Relation Age of Onset  . Diabetes Other        family hx of  . Breast cancer Paternal Aunt    Social History   Socioeconomic History  . Marital status: Widowed    Spouse name: Not on file  . Number of children: 2  . Years of education: 61  . Highest education level: Some college, no degree  Occupational History   . Occupation: Location manager    Comment: retired  Tobacco Use  . Smoking status: Former Smoker    Packs/day: 1.00    Years: 2.00    Pack years: 2.00    Types: Cigarettes  . Smokeless tobacco: Never Used  Vaping Use  . Vaping Use: Never used  Substance and Sexual Activity  . Alcohol use: No  . Drug use: No  . Sexual activity: Not Currently  Other Topics Concern  . Not on file  Social History Narrative   Gets on computer a lot during the day   Cleans house and watches TV   No caffeine use now for 3 weeks      Lives alone. She has 10 grandchildren.    Social Determinants of Health   Financial Resource Strain: Low Risk   . Difficulty of Paying Living Expenses: Not hard at all  Food Insecurity: No Food Insecurity  . Worried About Programme researcher, broadcasting/film/video in the Last Year: Never true  . Ran Out of Food in the Last Year: Never true  Transportation Needs: No Transportation Needs  . Lack of Transportation (Medical): No  . Lack of Transportation (Non-Medical): No  Physical Activity: Inactive  . Days of Exercise per Week: 0 days  . Minutes of Exercise per Session: 0 min  Stress: No Stress Concern Present  . Feeling of Stress : Not at all  Social Connections: Moderately Isolated  . Frequency of Communication with Friends and Family: More than three times a week  . Frequency of Social Gatherings with Friends and Family: Never  . Attends Religious Services: More than 4 times per year  . Active Member of Clubs or Organizations: No  . Attends Banker Meetings: Never  . Marital Status: Widowed    Activities of Daily Living In your present state of health, do you have any difficulty performing the following activities: 10/17/2020  Hearing? N  Vision? Y  Comment feels that she needs new glasses; she has an appt in July.  Difficulty concentrating or making decisions? N  Walking or climbing stairs? N  Dressing or bathing? N  Doing errands, shopping? N  Preparing Food and  eating ? N  Using the Toilet? N  In the past six months, have you accidently leaked urine? N  Do you have problems with loss of bowel control? N  Managing your Medications? N  Managing your Finances? N  Housekeeping or managing your Housekeeping? N  Some recent data might be hidden    Patient Education/ Literacy How often do you need to have someone help you when you read instructions, pamphlets, or other written materials from your doctor or pharmacy?: 1 - Never What is the last grade level you completed in school?: High school graduate + 2 years of college  Exercise Current Exercise Habits: Home exercise routine, Type of exercise: stretching, Time (Minutes):  10, Frequency (Times/Week): 5, Weekly Exercise (Minutes/Week): 50, Intensity: Mild  Diet Patient reports consuming 2 meals a day and 2 snack(s) a day Patient reports that her primary diet is: Regular Patient reports that she does have regular access to food.   Depression Screen PHQ 2/9 Scores 10/17/2020 08/28/2020 12/06/2019 11/03/2018 08/24/2018 07/05/2018 03/03/2018  PHQ - 2 Score 0 2 2 0 0 1 1  PHQ- 9 Score - - 8 - - - 5     Fall Risk Fall Risk  10/17/2020 08/28/2020 08/28/2020 12/06/2019 08/24/2018  Falls in the past year? 0 0 0 0 0  Number falls in past yr: 0 - 0 - -  Injury with Fall? 0 - 0 - -  Risk for fall due to : No Fall Risks - No Fall Risks Impaired balance/gait Impaired balance/gait;Impaired mobility  Follow up Falls evaluation completed - Falls evaluation completed - Falls prevention discussed     Objective:  MARDELLE PANDOLFI seemed alert and oriented and she participated appropriately during our telephone visit.  Blood Pressure Weight BMI  BP Readings from Last 3 Encounters:  08/28/20 118/62  02/07/20 126/65  01/03/20 (!) 125/50   Wt Readings from Last 3 Encounters:  08/28/20 266 lb (120.7 kg)  02/07/20 265 lb 1.9 oz (120.3 kg)  01/03/20 268 lb (121.6 kg)   BMI Readings from Last 1 Encounters:  08/28/20 45.66  kg/m    *Unable to obtain current vital signs, weight, and BMI due to telephone visit type  Hearing/Vision  . Lea did not seem to have difficulty with hearing/understanding during the telephone conversation . Reports that she has not had a formal eye exam by an eye care professional within the past year but she does have an appointment set for July, 2022. Marland Kitchen Reports that she has not had a formal hearing evaluation within the past year *Unable to fully assess hearing and vision during telephone visit type  Cognitive Function: 6CIT Screen 10/17/2020 08/24/2018  What Year? 0 points 0 points  What month? 0 points 0 points  What time? 0 points 0 points  Count back from 20 0 points 0 points  Months in reverse 0 points 0 points  Repeat phrase 2 points 2 points  Total Score 2 2   (Normal:0-7, Significant for Dysfunction: >8)  Normal Cognitive Function Screening: Yes   Immunization & Health Maintenance Record Immunization History  Administered Date(s) Administered  . Influenza Split 04/11/2012  . Influenza Whole 03/17/2013  . Influenza, High Dose Seasonal PF 03/07/2019  . Influenza,inj,Quad PF,6+ Mos 02/16/2014, 04/01/2017, 03/03/2018, 04/18/2018  . Influenza-Unspecified 04/08/2015, 03/18/2016, 04/01/2017, 05/15/2020  . Moderna Sars-Covid-2 Vaccination 08/24/2019, 09/21/2019, 05/15/2020  . Pneumococcal Polysaccharide-23 03/23/2019  . Tdap 06/12/2011    Health Maintenance  Topic Date Due  . PNA vac Low Risk Adult (2 of 2 - PCV13) 10/17/2021 (Originally 03/22/2020)  . INFLUENZA VACCINE  01/13/2021  . TETANUS/TDAP  06/11/2021  . MAMMOGRAM  08/29/2022  . COLONOSCOPY (Pts 45-106yrs Insurance coverage will need to be confirmed)  02/03/2027  . DEXA SCAN  Completed  . COVID-19 Vaccine  Completed  . Hepatitis C Screening  Completed  . HPV VACCINES  Aged Out       Assessment  This is a routine wellness examination for CORTNI TAYS.  Health Maintenance: Due or Overdue There are  no preventive care reminders to display for this patient.  Terri Farrell does not need a referral for Community Assistance: Care Management:   no Social Work:  no Prescription Assistance:  no Nutrition/Diabetes Education:  no   Plan:  Personalized Goals Goals Addressed              This Visit's Progress   .  Patient Stated (pt-stated)        10/17/2020 AWV Goal: Exercise for General Health   Patient will verbalize understanding of the benefits of increased physical activity:  Exercising regularly is important. It will improve your overall fitness, flexibility, and endurance.  Regular exercise also will improve your overall health. It can help you control your weight, reduce stress, and improve your bone density.  Over the next year, patient will increase physical activity as tolerated with a goal of at least 150 minutes of moderate physical activity per week.   You can tell that you are exercising at a moderate intensity if your heart starts beating faster and you start breathing faster but can still hold a conversation.  Moderate-intensity exercise ideas include:  Walking 1 mile (1.6 km) in about 15 minutes  Biking  Hiking  Golfing  Dancing  Water aerobics  Patient will verbalize understanding of everyday activities that increase physical activity by providing examples like the following: ? Yard work, such as: ? Pushing a Surveyor, mininglawn mower ? Raking and bagging leaves ? Washing your car ? Pushing a stroller ? Shoveling snow ? Gardening ? Washing windows or floors  Patient will be able to explain general safety guidelines for exercising:   Before you start a new exercise program, talk with your health care provider.  Do not exercise so much that you hurt yourself, feel dizzy, or get very short of breath.  Wear comfortable clothes and wear shoes with good support.  Drink plenty of water while you exercise to prevent dehydration or heat stroke.  Work out  until your breathing and your heartbeat get faster.       Personalized Health Maintenance & Screening Recommendations  Pneumococcal vaccine  Td vaccine Shingles vaccine  Patient is aware about the shingles vaccine but due to cost she is waiting to change insurance plans and see if it would be a lower co-pay.  Td vaccine will be due in December, 2022.  Lung Cancer Screening Recommended: no (Low Dose CT Chest recommended if Age 67-80 years, 30 pack-year currently smoking OR have quit w/in past 15 years) Hepatitis C Screening recommended: no HIV Screening recommended: no  Advanced Directives: Written information was not prepared per patient's request.  Referrals & Orders No orders of the defined types were placed in this encounter.   Follow-up Plan . Follow-up with Agapito GamesMetheney, Catherine D, MD as planned . Schedule your shingle vaccine at your pharmacy. Tetanus shot will be due in December and that can also be done at your pharmacy.  . Pneumonia vaccine can be done at your next in-office visit in September.  . Medicare wellness visit in one year.   I have personally reviewed and noted the following in the patient's chart:   . Medical and social history . Use of alcohol, tobacco or illicit drugs  . Current medications and supplements . Functional ability and status . Nutritional status . Physical activity . Advanced directives . List of other physicians . Hospitalizations, surgeries, and ER visits in previous 12 months . Vitals . Screenings to include cognitive, depression, and falls . Referrals and appointments  In addition, I have reviewed and discussed with Terri Noticeeborah J Erskin certain preventive protocols, quality metrics, and best practice recommendations. A written personalized care plan for  preventive services as well as general preventive health recommendations is available and can be mailed to the patient at her request.      Modesto Charon, RN  10/17/2020

## 2020-10-17 NOTE — Patient Instructions (Addendum)
MEDICARE ANNUAL WELLNESS VISIT Health Maintenance Summary and Written Plan of Care  [name] ,  Thank you for allowing me to perform your Medicare Annual Wellness Visit and for your ongoing commitment to your health.   Health Maintenance & Immunization History Health Maintenance  Topic Date Due  . PNA vac Low Risk Adult (2 of 2 - PCV13) 10/17/2021 (Originally 03/22/2020)  . INFLUENZA VACCINE  01/13/2021  . TETANUS/TDAP  06/11/2021  . MAMMOGRAM  08/29/2022  . COLONOSCOPY (Pts 45-66yrs Insurance coverage will need to be confirmed)  02/03/2027  . DEXA SCAN  Completed  . COVID-19 Vaccine  Completed  . Hepatitis C Screening  Completed  . HPV VACCINES  Aged Out   Immunization History  Administered Date(s) Administered  . Influenza Split 04/11/2012  . Influenza Whole 03/17/2013  . Influenza, High Dose Seasonal PF 03/07/2019  . Influenza,inj,Quad PF,6+ Mos 02/16/2014, 04/01/2017, 03/03/2018, 04/18/2018  . Influenza-Unspecified 04/08/2015, 03/18/2016, 04/01/2017, 05/15/2020  . Moderna Sars-Covid-2 Vaccination 08/24/2019, 09/21/2019, 05/15/2020  . Pneumococcal Polysaccharide-23 03/23/2019  . Tdap 06/12/2011    These are the patient goals that we discussed: Goals Addressed              This Visit's Progress   .  Patient Stated (pt-stated)        10/17/2020 AWV Goal: Exercise for General Health   Patient will verbalize understanding of the benefits of increased physical activity:  Exercising regularly is important. It will improve your overall fitness, flexibility, and endurance.  Regular exercise also will improve your overall health. It can help you control your weight, reduce stress, and improve your bone density.  Over the next year, patient will increase physical activity as tolerated with a goal of at least 150 minutes of moderate physical activity per week.   You can tell that you are exercising at a moderate intensity if your heart starts beating faster and you start  breathing faster but can still hold a conversation.  Moderate-intensity exercise ideas include:  Walking 1 mile (1.6 km) in about 15 minutes  Biking  Hiking  Golfing  Dancing  Water aerobics  Patient will verbalize understanding of everyday activities that increase physical activity by providing examples like the following: ? Yard work, such as: ? Pushing a Surveyor, mining ? Raking and bagging leaves ? Washing your car ? Pushing a stroller ? Shoveling snow ? Gardening ? Washing windows or floors  Patient will be able to explain general safety guidelines for exercising:   Before you start a new exercise program, talk with your health care provider.  Do not exercise so much that you hurt yourself, feel dizzy, or get very short of breath.  Wear comfortable clothes and wear shoes with good support.  Drink plenty of water while you exercise to prevent dehydration or heat stroke.  Work out until your breathing and your heartbeat get faster.         This is a list of Health Maintenance Items that are overdue or due now: Pneumococcal vaccine  Td vaccine Shingles vaccine  Orders/Referrals Placed Today: No orders of the defined types were placed in this encounter.  (Contact our referral department at [phone] if you have not spoken with someone about your referral appointment within the next 5 days)    Follow-up Plan . Follow-up with Agapito Games, MD as planned . Schedule your shingle vaccine at your pharmacy. Tetanus shot will be due in December and that can also be done at your pharmacy.  Marland Kitchen  Pneumonia vaccine can be done at your next in-office visit in September.  . Medicare wellness visit in one year.       Health Maintenance, Female Adopting a healthy lifestyle and getting preventive care are important in promoting health and wellness. Ask your health care provider about:  The right schedule for you to have regular tests and exams.  Things you can  do on your own to prevent diseases and keep yourself healthy. What should I know about diet, weight, and exercise? Eat a healthy diet  Eat a diet that includes plenty of vegetables, fruits, low-fat dairy products, and lean protein.  Do not eat a lot of foods that are high in solid fats, added sugars, or sodium.   Maintain a healthy weight Body mass index (BMI) is used to identify weight problems. It estimates body fat based on height and weight. Your health care provider can help determine your BMI and help you achieve or maintain a healthy weight. Get regular exercise Get regular exercise. This is one of the most important things you can do for your health. Most adults should:  Exercise for at least 150 minutes each week. The exercise should increase your heart rate and make you sweat (moderate-intensity exercise).  Do strengthening exercises at least twice a week. This is in addition to the moderate-intensity exercise.  Spend less time sitting. Even light physical activity can be beneficial. Watch cholesterol and blood lipids Have your blood tested for lipids and cholesterol at 68 years of age, then have this test every 5 years. Have your cholesterol levels checked more often if:  Your lipid or cholesterol levels are high.  You are older than 68 years of age.  You are at high risk for heart disease. What should I know about cancer screening? Depending on your health history and family history, you may need to have cancer screening at various ages. This may include screening for:  Breast cancer.  Cervical cancer.  Colorectal cancer.  Skin cancer.  Lung cancer. What should I know about heart disease, diabetes, and high blood pressure? Blood pressure and heart disease  High blood pressure causes heart disease and increases the risk of stroke. This is more likely to develop in people who have high blood pressure readings, are of African descent, or are overweight.  Have your  blood pressure checked: ? Every 3-5 years if you are 74-44 years of age. ? Every year if you are 29 years old or older. Diabetes Have regular diabetes screenings. This checks your fasting blood sugar level. Have the screening done:  Once every three years after age 68 if you are at a normal weight and have a low risk for diabetes.  More often and at a younger age if you are overweight or have a high risk for diabetes. What should I know about preventing infection? Hepatitis B If you have a higher risk for hepatitis B, you should be screened for this virus. Talk with your health care provider to find out if you are at risk for hepatitis B infection. Hepatitis C Testing is recommended for:  Everyone born from [birth year] through 1963.  Anyone with known risk factors for hepatitis C. Sexually transmitted infections (STIs)  Get screened for STIs, including gonorrhea and chlamydia, if: ? You are sexually active and are younger than 69 years of age. ? You are older than 68 years of age and your health care provider tells you that you are at risk for this type of  infection. ? Your sexual activity has changed since you were last screened, and you are at increased risk for chlamydia or gonorrhea. Ask your health care provider if you are at risk.  Ask your health care provider about whether you are at high risk for HIV. Your health care provider may recommend a prescription medicine to help prevent HIV infection. If you choose to take medicine to prevent HIV, you should first get tested for HIV. You should then be tested every 3 months for as long as you are taking the medicine. Pregnancy  If you are about to stop having your period (premenopausal) and you may become pregnant, seek counseling before you get pregnant.  Take 400 to 800 micrograms (mcg) of folic acid every day if you become pregnant.  Ask for birth control (contraception) if you want to prevent pregnancy. Osteoporosis and  menopause Osteoporosis is a disease in which the bones lose minerals and strength with aging. This can result in bone fractures. If you are 27 years old or older, or if you are at risk for osteoporosis and fractures, ask your health care provider if you should:  Be screened for bone loss.  Take a calcium or vitamin D supplement to lower your risk of fractures.  Be given hormone replacement therapy (HRT) to treat symptoms of menopause. Follow these instructions at home: Lifestyle  Do not use any products that contain nicotine or tobacco, such as cigarettes, e-cigarettes, and chewing tobacco. If you need help quitting, ask your health care provider.  Do not use street drugs.  Do not share needles.  Ask your health care provider for help if you need support or information about quitting drugs. Alcohol use  Do not drink alcohol if: ? Your health care provider tells you not to drink. ? You are pregnant, may be pregnant, or are planning to become pregnant.  If you drink alcohol: ? Limit how much you use to 0-1 drink a day. ? Limit intake if you are breastfeeding.  Be aware of how much alcohol is in your drink. In the [place] one drink equals one 12 oz bottle of beer (355 mL), one 5 oz glass of wine (148 mL), or one 1 oz glass of hard liquor (44 mL). General instructions  Schedule regular health, dental, and eye exams.  Stay current with your vaccines.  Tell your health care provider if: ? You often feel depressed. ? You have ever been abused or do not feel safe at home. Summary  Adopting a healthy lifestyle and getting preventive care are important in promoting health and wellness.  Follow your health care provider's instructions about healthy diet, exercising, and getting tested or screened for diseases.  Follow your health care provider's instructions on monitoring your cholesterol and blood pressure. This information is not intended to replace advice given to you by your  health care provider. Make sure you discuss any questions you have with your health care provider. Document Revised: 05/25/2018 Document Reviewed: 05/25/2018 Elsevier Patient Education  2021 [employer].

## 2020-12-18 DIAGNOSIS — H524 Presbyopia: Secondary | ICD-10-CM | POA: Diagnosis not present

## 2020-12-18 DIAGNOSIS — H40023 Open angle with borderline findings, high risk, bilateral: Secondary | ICD-10-CM | POA: Diagnosis not present

## 2020-12-18 DIAGNOSIS — H2513 Age-related nuclear cataract, bilateral: Secondary | ICD-10-CM | POA: Diagnosis not present

## 2021-01-20 DIAGNOSIS — H40023 Open angle with borderline findings, high risk, bilateral: Secondary | ICD-10-CM | POA: Diagnosis not present

## 2021-01-20 DIAGNOSIS — H2513 Age-related nuclear cataract, bilateral: Secondary | ICD-10-CM | POA: Diagnosis not present

## 2021-03-03 ENCOUNTER — Ambulatory Visit (INDEPENDENT_AMBULATORY_CARE_PROVIDER_SITE_OTHER): Payer: Medicare HMO | Admitting: Family Medicine

## 2021-03-03 ENCOUNTER — Encounter: Payer: Self-pay | Admitting: Family Medicine

## 2021-03-03 ENCOUNTER — Other Ambulatory Visit: Payer: Self-pay

## 2021-03-03 VITALS — BP 119/60 | HR 62 | Ht 64.0 in | Wt 272.0 lb

## 2021-03-03 DIAGNOSIS — N1831 Chronic kidney disease, stage 3a: Secondary | ICD-10-CM | POA: Diagnosis not present

## 2021-03-03 DIAGNOSIS — I10 Essential (primary) hypertension: Secondary | ICD-10-CM

## 2021-03-03 DIAGNOSIS — R202 Paresthesia of skin: Secondary | ICD-10-CM

## 2021-03-03 DIAGNOSIS — R7301 Impaired fasting glucose: Secondary | ICD-10-CM | POA: Diagnosis not present

## 2021-03-03 DIAGNOSIS — Z23 Encounter for immunization: Secondary | ICD-10-CM

## 2021-03-03 DIAGNOSIS — R519 Headache, unspecified: Secondary | ICD-10-CM

## 2021-03-03 LAB — POCT GLYCOSYLATED HEMOGLOBIN (HGB A1C): Hemoglobin A1C: 5.4 % (ref 4.0–5.6)

## 2021-03-03 NOTE — Progress Notes (Signed)
Established Patient Office Visit  Subjective:  Patient ID: Terri Farrell, female    DOB: 1950-07-21  Age: 70 y.o. MRN: [id number]  CC:  Chief Complaint  Patient presents with   Hypertension    HPI Terri Farrell presents for    Hypertension- Pt denies chest pain, SOB, dizziness, or heart palpitations.  Taking meds as directed w/o problems.  Denies medication side effects.  He has noticed some frequent headaches particularly in the evenings are usually frontal.  She says sometimes the back of her neck will hurt and she will have a frontal headache at the same time she is not sure if it is related to her blood pressure or if it could be from her sinuses or from working on the computer treatment.  F/U CKD 3 -no recent changes.  She feels like the Flonase is not really helping with her allergic eye symptoms.  She does feel like the Flonase helps some with her sinuses.  She had had her eyes checked by the eye doctor and they did rule out dry eye they do feel like the redness is coming from allergic conjunctivitis.  She has a lot of itching and irritation as well.  The eye doctor was going to write down an over-the-counter product for her before she left but forgot.    Past Medical History:  Diagnosis Date   Arthritis    RA   Depression     Past Surgical History:  Procedure Laterality Date   ABDOMINAL HYSTERECTOMY  1996   for fibroids   CARPAL TUNNEL RELEASE     right and left   CESAREAN SECTION     X 2   TOTAL KNEE ARTHROPLASTY  08/2010    Family History  Problem Relation Age of Onset   Diabetes Other        family hx of   Breast cancer Paternal Aunt     Social History   Socioeconomic History   Marital status: Widowed    Spouse name: Not on file   Number of children: 2   Years of education: 14   Highest education level: Some college, no degree  Occupational History   Occupation: Location manager    Comment: retired  Tobacco Use   Smoking status: Former     Packs/day: 1.00    Years: 2.00    Pack years: 2.00    Types: Cigarettes   Smokeless tobacco: Never  Vaping Use   Vaping Use: Never used  Substance and Sexual Activity   Alcohol use: No   Drug use: No   Sexual activity: Not Currently  Other Topics Concern   Not on file  Social History Narrative   Gets on computer a lot during the day   Cleans house and watches TV   No caffeine use now for 3 weeks      Lives alone. She has 10 grandchildren.    Social Determinants of Health   Financial Resource Strain: Low Risk    Difficulty of Paying Living Expenses: Not hard at all  Food Insecurity: No Food Insecurity   Worried About Programme researcher, broadcasting/film/video in the Last Year: Never true   Ran Out of Food in the Last Year: Never true  Transportation Needs: No Transportation Needs   Lack of Transportation (Medical): No   Lack of Transportation (Non-Medical): No  Physical Activity: Inactive   Days of Exercise per Week: 0 days   Minutes of Exercise per Session: 0 min  Stress: No Stress Concern Present   Feeling of Stress : Not at all  Social Connections: Moderately Isolated   Frequency of Communication with Friends and Family: More than three times a week   Frequency of Social Gatherings with Friends and Family: Never   Attends Religious Services: More than 4 times per year   Active Member of [employer] or Organizations: No   Attends Banker Meetings: Never   Marital Status: Widowed  Catering manager Violence: Not on file    Outpatient Medications Prior to Visit  Medication Sig Dispense Refill   fluticasone (FLONASE) 50 MCG/ACT nasal spray SPRAY 2 SPRAYS INTO EACH NOSTRIL EVERY DAY 48 mL 6   hydrochlorothiazide (MICROZIDE) 12.5 MG capsule TAKE 1 CAPSULE BY MOUTH EVERY DAY 90 capsule 1   nystatin (MYCOSTATIN/NYSTOP) powder Apply 1 application topically 3 (three) times daily. 60 g 1   Thiamine HCl (VITAMIN B1 PO) Take 1 tablet by mouth daily.     Vitamin D, Ergocalciferol, (DRISDOL)  1.25 MG (50000 UNIT) CAPS capsule Take 50,000 Units by mouth every 7 (seven) days.     No facility-administered medications prior to visit.    Allergies  Allergen Reactions   Aspirin Other (See Comments) and Shortness Of Breath    Other reaction(s): Dizziness   Lisinopril Other (See Comments)    Fatigue  Other reaction(s): Other Fatigue   Cymbalta [Duloxetine Hcl] Other (See Comments)    Sedation, inc hunger   Losartan Other (See Comments)    Weakness and fatigue   Olmesartan Other (See Comments)    GI upset, chest pain   Sertraline Other (See Comments)    hallucinations   Wellbutrin [Bupropion] Other (See Comments)    Patient reports the medication caused her to cry and be tired.     ROS Review of Systems    Objective:    Physical Exam Constitutional:      Appearance: Normal appearance. She is well-developed.  HENT:     Head: Normocephalic and atraumatic.  Cardiovascular:     Rate and Rhythm: Normal rate and regular rhythm.     Heart sounds: Normal heart sounds.  Pulmonary:     Effort: Pulmonary effort is normal.     Breath sounds: Normal breath sounds.  Skin:    General: Skin is warm and dry.  Neurological:     Mental Status: She is alert and oriented to person, place, and time.  Psychiatric:        Behavior: Behavior normal.    BP 119/60   Pulse 62   Ht 5\' 4"  (1.626 m)   Wt 272 lb (123.4 kg)   SpO2 99%   BMI 46.69 kg/m  Wt Readings from Last 3 Encounters:  03/03/21 272 lb (123.4 kg)  08/28/20 266 lb (120.7 kg)  02/07/20 265 lb 1.9 oz (120.3 kg)     Health Maintenance Due  Topic Date Due   Zoster Vaccines- Shingrix (1 of 2) Never done    There are no preventive care reminders to display for this patient.  Lab Results  Component Value Date   TSH 2.58 08/24/2019   Lab Results  Component Value Date   WBC 7.4 08/24/2019   HGB 13.2 08/24/2019   HCT 40.1 08/24/2019   MCV 92.4 08/24/2019   PLT 128 (L) 08/24/2019   Lab Results  Component  Value Date   NA 136 08/28/2020   K 4.6 08/28/2020   CO2 22 08/28/2020   GLUCOSE 94 08/28/2020   BUN  16 08/28/2020   CREATININE 0.81 08/28/2020   BILITOT 0.5 08/28/2020   ALKPHOS 54 01/27/2017   AST 19 08/28/2020   ALT 15 08/28/2020   PROT 7.1 08/28/2020   ALBUMIN 4.4 01/27/2017   CALCIUM 9.9 08/28/2020   Lab Results  Component Value Date   CHOL 194 08/28/2020   Lab Results  Component Value Date   HDL 68 08/28/2020   Lab Results  Component Value Date   LDLCALC 108 (H) 08/28/2020   Lab Results  Component Value Date   TRIG 90 08/28/2020   Lab Results  Component Value Date   CHOLHDL 2.9 08/28/2020   Lab Results  Component Value Date   HGBA1C 5.4 03/03/2021      Assessment & Plan:   Problem List Items Addressed This Visit       Cardiovascular and Mediastinum   Essential hypertension, benign - Primary    Well controlled. Continue current regimen. Follow up in  6 mo due for BMP.       Relevant Orders   BASIC METABOLIC PANEL WITH GFR     Genitourinary   Stage 3a chronic kidney disease (HCC)    Due to recheck kidney function.       Other Visit Diagnoses     IFG (impaired fasting glucose)       Relevant Orders   POCT glycosylated hemoglobin (Hb A1C) (Completed)   Need for immunization against influenza       Relevant Orders   Flu Vaccine QUAD High Dose(Fluad) (Completed)   Frontal headache       Paresthesias       Relevant Orders   Ambulatory referral to Neurology       Frontal headache-did encourage her to try to check her blood pressure when it happens just to rule out elevated blood pressure levels.  Her blood pressure looks phenomenal today some a little less concerned about that but certainly could keep an eye on it sounds like it could be somewhat positional as her neck is hurting at the same time especially if she spending more time on the computer.  Just encouraged her to work on gentle stretches and taking breaks.  Still experiencing  tingling in her legs and feet from about the knees down bilaterally.  She had several mineral deficiencies which have now been corrected and she still having discomfort so we discussed placing referral to neurology for further work-up.  No orders of the defined types were placed in this encounter.   Follow-up: Return in about 6 months (around 08/31/2021).    Nani Gasser, MD

## 2021-03-03 NOTE — Patient Instructions (Signed)
Recommend a trial of over-the-counter Zaditor for your allergic eye symptoms.

## 2021-03-03 NOTE — Assessment & Plan Note (Signed)
Well controlled. Continue current regimen. Follow up in  6 mo due for BMP 

## 2021-03-03 NOTE — Assessment & Plan Note (Signed)
Due to recheck kidney function 

## 2021-03-04 LAB — BASIC METABOLIC PANEL WITH GFR
BUN: 19 mg/dL (ref 7–25)
CO2: 27 mmol/L (ref 20–32)
Calcium: 9.7 mg/dL (ref 8.6–10.4)
Chloride: 103 mmol/L (ref 98–110)
Creat: 0.96 mg/dL (ref 0.50–1.05)
Glucose, Bld: 91 mg/dL (ref 65–99)
Potassium: 4.3 mmol/L (ref 3.5–5.3)
Sodium: 139 mmol/L (ref 135–146)
eGFR: 64 mL/min/{1.73_m2} (ref >=60)

## 2021-03-04 NOTE — Progress Notes (Signed)
Your lab work is within acceptable range and there are no concerning findings.   ?

## 2021-04-05 ENCOUNTER — Other Ambulatory Visit: Payer: Self-pay | Admitting: Family Medicine

## 2021-04-17 DIAGNOSIS — R200 Anesthesia of skin: Secondary | ICD-10-CM | POA: Diagnosis not present

## 2021-04-17 DIAGNOSIS — R202 Paresthesia of skin: Secondary | ICD-10-CM | POA: Diagnosis not present

## 2021-05-19 DIAGNOSIS — H40023 Open angle with borderline findings, high risk, bilateral: Secondary | ICD-10-CM | POA: Diagnosis not present

## 2021-06-15 HISTORY — PX: MULTIPLE TOOTH EXTRACTIONS: SHX2053

## 2021-07-22 ENCOUNTER — Other Ambulatory Visit: Payer: Self-pay | Admitting: Family Medicine

## 2021-07-22 DIAGNOSIS — Z1231 Encounter for screening mammogram for malignant neoplasm of breast: Secondary | ICD-10-CM

## 2021-09-01 ENCOUNTER — Other Ambulatory Visit: Payer: Self-pay

## 2021-09-01 ENCOUNTER — Encounter: Payer: Self-pay | Admitting: Family Medicine

## 2021-09-01 ENCOUNTER — Ambulatory Visit (INDEPENDENT_AMBULATORY_CARE_PROVIDER_SITE_OTHER): Payer: Medicare Other | Admitting: Family Medicine

## 2021-09-01 VITALS — BP 136/69 | HR 77 | Ht 64.0 in | Wt 281.0 lb

## 2021-09-01 DIAGNOSIS — R7301 Impaired fasting glucose: Secondary | ICD-10-CM

## 2021-09-01 DIAGNOSIS — N1831 Chronic kidney disease, stage 3a: Secondary | ICD-10-CM | POA: Diagnosis not present

## 2021-09-01 DIAGNOSIS — M5430 Sciatica, unspecified side: Secondary | ICD-10-CM

## 2021-09-01 DIAGNOSIS — I10 Essential (primary) hypertension: Secondary | ICD-10-CM

## 2021-09-01 DIAGNOSIS — F99 Mental disorder, not otherwise specified: Secondary | ICD-10-CM

## 2021-09-01 LAB — POCT GLYCOSYLATED HEMOGLOBIN (HGB A1C): Hemoglobin A1C: 4.9 % (ref 4.0–5.6)

## 2021-09-01 NOTE — Progress Notes (Addendum)
Established Patient Office Visit  Subjective:  Patient ID: Terri Farrell, female    DOB: [birth year]  Age: 71 y.o. MRN: [id number]  CC:  Chief Complaint  Patient presents with   Hypertension   ifg    HPI Terri Farrell presents for   Hypertension- Pt denies chest pain, SOB, dizziness, or heart palpitations.  Taking meds as directed w/o problems.  Denies medication side effects.    Impaired fasting glucose-no increased thirst or urination. No symptoms consistent with hypoglycemia.  F/U CKD 3 -stable.  Due to recheck renal function.  She is really frustrated with herself she gained some weight over the holidays she started slipping and eating snacks again and eating not so healthy snacks.  And knows that she just needs to get back on track.  Is gained about 9 pounds since I saw her in the fall.  She feels like she is having more aches and pains since she put the weight back on.  He says her sciatica is also been flaring a little bit more over the last 2 to 3 weeks.  She says if she gets up and moves around it actually eases off and it feels better.  Reports that Tdap was given in October 2022.  She did have the nurse come out from her Medicare company to do an assessment.  She did have an abnormal clock drawing.  Past Medical History:  Diagnosis Date   Arthritis    RA   Depression     Past Surgical History:  Procedure Laterality Date   ABDOMINAL HYSTERECTOMY  1996   for fibroids   CARPAL TUNNEL RELEASE     right and left   CESAREAN SECTION     X 2   TOTAL KNEE ARTHROPLASTY  08/2010    Family History  Problem Relation Age of Onset   Diabetes Other        family hx of   Breast cancer Paternal Aunt     Social History   Socioeconomic History   Marital status: Widowed    Spouse name: Not on file   Number of children: 2   Years of education: 14   Highest education level: Some college, no degree  Occupational History   Occupation: Location manager    Comment:  retired  Tobacco Use   Smoking status: Former    Packs/day: 1.00    Years: 2.00    Pack years: 2.00    Types: Cigarettes   Smokeless tobacco: Never  Vaping Use   Vaping Use: Never used  Substance and Sexual Activity   Alcohol use: No   Drug use: No   Sexual activity: Not Currently  Other Topics Concern   Not on file  Social History Narrative   Gets on computer a lot during the day   Cleans house and watches TV   No caffeine use now for 3 weeks      Lives alone. She has 10 grandchildren.    Social Determinants of Health   Financial Resource Strain: Low Risk    Difficulty of Paying Living Expenses: Not hard at all  Food Insecurity: No Food Insecurity   Worried About Programme researcher, broadcasting/film/video in the Last Year: Never true   Ran Out of Food in the Last Year: Never true  Transportation Needs: No Transportation Needs   Lack of Transportation (Medical): No   Lack of Transportation (Non-Medical): No  Physical Activity: Inactive   Days of Exercise per Week: 0  days   Minutes of Exercise per Session: 0 min  Stress: No Stress Concern Present   Feeling of Stress : Not at all  Social Connections: Moderately Isolated   Frequency of Communication with Friends and Family: More than three times a week   Frequency of Social Gatherings with Friends and Family: Never   Attends Religious Services: More than 4 times per year   Active Member of [employer] or Organizations: No   Attends Banker Meetings: Never   Marital Status: Widowed  Catering manager Violence: Not on file    Outpatient Medications Prior to Visit  Medication Sig Dispense Refill   fluticasone (FLONASE) 50 MCG/ACT nasal spray SPRAY 2 SPRAYS INTO EACH NOSTRIL EVERY DAY 48 mL 6   hydrochlorothiazide (MICROZIDE) 12.5 MG capsule TAKE 1 CAPSULE BY MOUTH EVERY DAY 90 capsule 1   nystatin (MYCOSTATIN/NYSTOP) powder Apply 1 application topically 3 (three) times daily. 60 g 1   Thiamine HCl (VITAMIN B1 PO) Take 1 tablet by mouth  daily.     Vitamin D, Ergocalciferol, (DRISDOL) 1.25 MG (50000 UNIT) CAPS capsule Take 50,000 Units by mouth every 7 (seven) days.     No facility-administered medications prior to visit.    Allergies  Allergen Reactions   Aspirin Other (See Comments) and Shortness Of Breath    Other reaction(s): Dizziness   Lisinopril Other (See Comments)    Fatigue  Other reaction(s): Other Fatigue   Cymbalta [Duloxetine Hcl] Other (See Comments)    Sedation, inc hunger   Losartan Other (See Comments)    Weakness and fatigue   Olmesartan Other (See Comments)    GI upset, chest pain   Sertraline Other (See Comments)    hallucinations   Wellbutrin [Bupropion] Other (See Comments)    Patient reports the medication caused her to cry and be tired.     ROS Review of Systems    Objective:    Physical Exam Constitutional:      Appearance: Normal appearance. She is well-developed.  HENT:     Head: Normocephalic and atraumatic.  Cardiovascular:     Rate and Rhythm: Normal rate and regular rhythm.     Heart sounds: Normal heart sounds.  Pulmonary:     Effort: Pulmonary effort is normal.     Breath sounds: Normal breath sounds.  Skin:    General: Skin is warm and dry.  Neurological:     Mental Status: She is alert and oriented to person, place, and time.  Psychiatric:        Behavior: Behavior normal.    BP 136/69   Pulse 77   Ht 5\' 4"  (1.626 m)   Wt 281 lb (127.5 kg)   SpO2 97%   BMI 48.23 kg/m  Wt Readings from Last 3 Encounters:  09/01/21 281 lb (127.5 kg)  03/03/21 272 lb (123.4 kg)  08/28/20 266 lb (120.7 kg)     Health Maintenance Due  Topic Date Due   Zoster Vaccines- Shingrix (1 of 2) Never done    There are no preventive care reminders to display for this patient.  Lab Results  Component Value Date   TSH 2.58 08/24/2019   Lab Results  Component Value Date   WBC 7.4 08/24/2019   HGB 13.2 08/24/2019   HCT 40.1 08/24/2019   MCV 92.4 08/24/2019   PLT 128  (L) 08/24/2019   Lab Results  Component Value Date   NA 139 03/03/2021   K 4.3 03/03/2021   CO2 27  03/03/2021   GLUCOSE 91 03/03/2021   BUN 19 03/03/2021   CREATININE 0.96 03/03/2021   BILITOT 0.5 08/28/2020   ALKPHOS 54 01/27/2017   AST 19 08/28/2020   ALT 15 08/28/2020   PROT 7.1 08/28/2020   ALBUMIN 4.4 01/27/2017   CALCIUM 9.7 03/03/2021   EGFR 64 03/03/2021   Lab Results  Component Value Date   CHOL 194 08/28/2020   Lab Results  Component Value Date   HDL 68 08/28/2020   Lab Results  Component Value Date   LDLCALC 108 (H) 08/28/2020   Lab Results  Component Value Date   TRIG 90 08/28/2020   Lab Results  Component Value Date   CHOLHDL 2.9 08/28/2020   Lab Results  Component Value Date   HGBA1C 4.9 09/01/2021      Assessment & Plan:   Problem List Items Addressed This Visit       Cardiovascular and Mediastinum   Essential hypertension, benign    Well controlled. Continue current regimen. Follow up in  6 mo      Relevant Orders   Lipid Panel w/reflex Direct LDL   COMPLETE METABOLIC PANEL WITH GFR   CBC     Endocrine   IFG (impaired fasting glucose) - Primary    1C looks phenomenal today at 4.9 so even though she did gain some weight her A1c actually went down.      Relevant Orders   POCT glycosylated hemoglobin (Hb A1C) (Completed)   Lipid Panel w/reflex Direct LDL   COMPLETE METABOLIC PANEL WITH GFR   CBC     Genitourinary   Stage 3a chronic kidney disease (HCC)    Following renal function every 6 months.      Relevant Orders   Lipid Panel w/reflex Direct LDL   COMPLETE METABOLIC PANEL WITH GFR   CBC     Other   Severe obesity (BMI >= 40) (HCC)    BMI 48 today.  We just discussed setting some small goals each week for the next month and just moving forward and not worrying about where she has had in the past.  And just starting with where she sat and making some good changes.      Abnormal MMSE    09/01/21: MMSE score of 28/30.  Passing is 29/30 based on age and education.  We will monitor and plan to repeat again in 1 year.      Other Visit Diagnoses     Sciatica, unspecified laterality          Sciatica-continue to work on increasing mobility and movement she actually gets relief when she stands.  If not improving then please let us know.  Recommend that she get her shingles vaccine updated at the pharmacy.  No orders of the defined types were placed in this encounter.   Follow-up: Return in about 6 months (around 03/04/2022) for Hypertension.    Nani Gasser, MD

## 2021-09-01 NOTE — Assessment & Plan Note (Signed)
Well controlled. Continue current regimen. Follow up in  6 mo  

## 2021-09-01 NOTE — Assessment & Plan Note (Signed)
Following renal function every 6 months. 

## 2021-09-01 NOTE — Assessment & Plan Note (Addendum)
09/01/21: MMSE score of 28/30. Passing is 29/30 based on age and education.  We will monitor and plan to repeat again in 1 year. ?

## 2021-09-01 NOTE — Assessment & Plan Note (Signed)
1C looks phenomenal today at 4.9 so even though she did gain some weight her A1c actually went down. ?

## 2021-09-01 NOTE — Assessment & Plan Note (Signed)
BMI 48 today.  We just discussed setting some small goals each week for the next month and just moving forward and not worrying about where she has had in the past.  And just starting with where she sat and making some good changes. ?

## 2021-09-02 LAB — COMPLETE METABOLIC PANEL WITH GFR
AG Ratio: 1.4 (calc) (ref 1.0–2.5)
ALT: 15 U/L (ref 6–29)
AST: 19 U/L (ref 10–35)
Albumin: 4.3 g/dL (ref 3.6–5.1)
Alkaline phosphatase (APISO): 61 U/L (ref 37–153)
BUN: 19 mg/dL (ref 7–25)
CO2: 25 mmol/L (ref 20–32)
Calcium: 9.8 mg/dL (ref 8.6–10.4)
Chloride: 107 mmol/L (ref 98–110)
Creat: 1.05 mg/dL (ref 0.50–1.05)
Globulin: 3.1 g/dL (calc) (ref 1.9–3.7)
Glucose, Bld: 91 mg/dL (ref 65–99)
Potassium: 4.6 mmol/L (ref 3.5–5.3)
Sodium: 144 mmol/L (ref 135–146)
Total Bilirubin: 0.6 mg/dL (ref 0.2–1.2)
Total Protein: 7.4 g/dL (ref 6.1–8.1)
eGFR: 58 mL/min/{1.73_m2} — ABNORMAL LOW (ref >=60)

## 2021-09-02 LAB — LIPID PANEL W/REFLEX DIRECT LDL
Cholesterol: 190 mg/dL (ref <200)
HDL: 66 mg/dL (ref >=50)
LDL Cholesterol (Calc): 106 mg/dL (calc) — ABNORMAL HIGH
Non-HDL Cholesterol (Calc): 124 mg/dL (calc) (ref <130)
Total CHOL/HDL Ratio: 2.9 (calc) (ref <5.0)
Triglycerides: 85 mg/dL (ref <150)

## 2021-09-02 LAB — CBC
HCT: 40.9 % (ref 35.0–45.0)
Hemoglobin: 13.3 g/dL (ref 11.7–15.5)
MCH: 30.4 pg (ref 27.0–33.0)
MCHC: 32.5 g/dL (ref 32.0–36.0)
MCV: 93.4 fL (ref 80.0–100.0)
MPV: 13.1 fL — ABNORMAL HIGH (ref 7.5–12.5)
Platelets: 142 10*3/uL (ref 140–400)
RBC: 4.38 10*6/uL (ref 3.80–5.10)
RDW: 12.3 % (ref 11.0–15.0)
WBC: 7.3 10*3/uL (ref 3.8–10.8)

## 2021-09-02 NOTE — Progress Notes (Signed)
Call patient: Complete metabolic panel and blood count look good.  LDL cholesterol is up just a smidge it is 106.  Goal is less than 100.  Continue to work on healthy diet and regular exercise.

## 2021-09-11 ENCOUNTER — Ambulatory Visit (INDEPENDENT_AMBULATORY_CARE_PROVIDER_SITE_OTHER): Payer: Medicare Other

## 2021-09-11 DIAGNOSIS — Z1231 Encounter for screening mammogram for malignant neoplasm of breast: Secondary | ICD-10-CM | POA: Diagnosis not present

## 2021-09-11 NOTE — Progress Notes (Signed)
Please call patient. Normal mammogram.  Repeat in 1 year.  

## 2021-10-17 ENCOUNTER — Other Ambulatory Visit: Payer: Self-pay | Admitting: Family Medicine

## 2021-10-18 ENCOUNTER — Other Ambulatory Visit: Payer: Self-pay | Admitting: Family Medicine

## 2021-12-03 ENCOUNTER — Ambulatory Visit (INDEPENDENT_AMBULATORY_CARE_PROVIDER_SITE_OTHER): Payer: Medicare Other | Admitting: Family Medicine

## 2021-12-03 DIAGNOSIS — Z0000 Encounter for general adult medical examination without abnormal findings: Secondary | ICD-10-CM

## 2021-12-03 NOTE — Progress Notes (Signed)
MEDICARE ANNUAL WELLNESS VISIT  12/03/2021  Telephone Visit Disclaimer This Medicare AWV was conducted by telephone due to national recommendations for restrictions regarding the COVID-19 Pandemic (e.g. social distancing).  I verified, using two identifiers, that I am speaking with Terri Farrell or their authorized healthcare agent. I discussed the limitations, risks, security, and privacy concerns of performing an evaluation and management service by telephone and the potential availability of an in-person appointment in the future. The patient expressed understanding and agreed to proceed.  Location of Patient: Home Location of Provider (nurse):   In the office.  Subjective:    Terri Farrell is a 71 y.o. female patient of Metheney, Barbarann Ehlers, MD who had a Medicare Annual Wellness Visit today via telephone. Latisha is Retired and lives alone. she has 2 children. she reports that she is socially active and does interact with friends/family regularly. she is minimally physically active and enjoys watching television and using social media.  Patient Care Team: Agapito Games, MD as PCP - General (Family Medicine)     12/03/2021    9:05 AM 10/17/2020   11:05 AM 08/24/2018   11:10 AM  Advanced Directives  Does Patient Have a Medical Advance Directive? No No Yes  Type of Best boy of [place];Living will  Does patient want to make changes to medical advance directive?   No - Patient declined  Copy of Healthcare Power of Attorney in Chart?   No - copy requested  Would patient like information on creating a medical advance directive? No - Patient declined No - Patient declined     Hospital Utilization Over the Past 12 Months: # of hospitalizations or ER visits: 0 # of surgeries: 1  Review of Systems    Patient reports that her overall health is unchanged compared to last year.  History obtained from chart review and the patient  Patient  Reported Readings (BP, Pulse, CBG, Weight, etc) none  Pain Assessment Pain : 0-10 Pain Score: 2  Pain Type: Chronic pain Pain Location: Back Pain Orientation: Lower Pain Radiating Towards: down her legs Pain Descriptors / Indicators: Constant Pain Onset: More than a month ago Pain Frequency: Intermittent Pain Relieving Factors: rest  Pain Relieving Factors: rest  Current Medications & Allergies (verified) Allergies as of 12/03/2021       Reactions   Aspirin Other (See Comments), Shortness Of Breath   Other reaction(s): Dizziness   Lisinopril Other (See Comments)   Fatigue Other reaction(s): Other Fatigue   Cymbalta [duloxetine Hcl] Other (See Comments)   Sedation, inc hunger   Losartan Other (See Comments)   Weakness and fatigue   Olmesartan Other (See Comments)   GI upset, chest pain   Sertraline Other (See Comments)   hallucinations   Wellbutrin [bupropion] Other (See Comments)   Patient reports the medication caused her to cry and be tired.         Medication List        Accurate as of December 03, 2021  9:19 AM. If you have any questions, ask your nurse or doctor.          fluticasone 50 MCG/ACT nasal spray Commonly known as: FLONASE SPRAY 2 SPRAYS INTO EACH NOSTRIL EVERY DAY   hydrochlorothiazide 12.5 MG capsule Commonly known as: MICROZIDE TAKE 1 CAPSULE BY MOUTH EVERY DAY   nystatin powder Commonly known as: MYCOSTATIN/NYSTOP Apply 1 application topically 3 (three) times daily.   VITAMIN B1 PO Take 1  tablet by mouth daily.   Vitamin D (Ergocalciferol) 1.25 MG (50000 UNIT) Caps capsule Commonly known as: DRISDOL Take 50,000 Units by mouth every 7 (seven) days.   VITAMIN D-3 PO Take by mouth.        History (reviewed): Past Medical History:  Diagnosis Date   Arthritis    RA   Depression    Past Surgical History:  Procedure Laterality Date   ABDOMINAL HYSTERECTOMY  06/15/1994   for fibroids   CARPAL TUNNEL RELEASE     right and  left   CESAREAN SECTION     X 2   MULTIPLE TOOTH EXTRACTIONS  06/2021   had all of her teeth removed. now wears dentures   TOTAL KNEE ARTHROPLASTY  08/14/2010   Family History  Problem Relation Age of Onset   Diabetes Other        family hx of   Breast cancer Paternal Aunt    Social History   Socioeconomic History   Marital status: Widowed    Spouse name: Not on file   Number of children: 2   Years of education: 14   Highest education level: Some college, no degree  Occupational History   Occupation: Glass blower/designer    Comment: retired  Tobacco Use   Smoking status: Former    Packs/day: 1.00    Years: 2.00    Total pack years: 2.00    Types: Cigarettes   Smokeless tobacco: Never  Vaping Use   Vaping Use: Never used  Substance and Sexual Activity   Alcohol use: No   Drug use: No   Sexual activity: Not Currently  Other Topics Concern   Not on file  Social History Narrative   Gets on computer a lot during the day   Cleans house and watches TV   No caffeine use now for 3 weeks      Lives alone. She has 10 grandchildren.    Social Determinants of Health   Financial Resource Strain: Low Risk  (12/03/2021)   Overall Financial Resource Strain (CARDIA)    Difficulty of Paying Living Expenses: Not hard at all  Food Insecurity: No Food Insecurity (12/03/2021)   Hunger Vital Sign    Worried About Running Out of Food in the Last Year: Never true    Ran Out of Food in the Last Year: Never true  Transportation Needs: No Transportation Needs (12/03/2021)   PRAPARE - Hydrologist (Medical): No    Lack of Transportation (Non-Medical): No  Physical Activity: Inactive (12/03/2021)   Exercise Vital Sign    Days of Exercise per Week: 0 days    Minutes of Exercise per Session: 0 min  Stress: No Stress Concern Present (12/03/2021)   [place]    Feeling of Stress : Not at all  Social  Connections: Moderately Isolated (12/03/2021)   Social Connection and Isolation Panel [NHANES]    Frequency of Communication with Friends and Family: More than three times a week    Frequency of Social Gatherings with Friends and Family: Twice a week    Attends Religious Services: More than 4 times per year    Active Member of [employer] or Organizations: No    Attends Archivist Meetings: Never    Marital Status: Widowed    Activities of Daily Living    12/03/2021    9:13 AM  In your present state of health, do you have any  difficulty performing the following activities:  Hearing? 0  Vision? 0  Difficulty concentrating or making decisions? 0  Walking or climbing stairs? 1  Comment due to her arthritis  Dressing or bathing? 0  Doing errands, shopping? 0  Preparing Food and eating ? N  Using the Toilet? N  In the past six months, have you accidently leaked urine? Y  Comment sometimes  Do you have problems with loss of bowel control? N  Managing your Medications? N  Managing your Finances? N  Housekeeping or managing your Housekeeping? N    Patient Education/ Literacy How often do you need to have someone help you when you read instructions, pamphlets, or other written materials from your doctor or pharmacy?: 1 - Never What is the last grade level you completed in school?: two years of college  Exercise Current Exercise Habits: The patient does not participate in regular exercise at present, Exercise limited by: None identified  Diet Patient reports consuming 2 meals a day and 2 snack(s) a day Patient reports that her primary diet is: Regular Patient reports that she does have regular access to food.   Depression Screen    12/03/2021    9:06 AM 09/01/2021   10:02 AM 10/17/2020   11:05 AM 08/28/2020   10:13 AM 12/06/2019   11:17 AM 11/03/2018   10:23 AM 08/24/2018   11:12 AM  PHQ 2/9 Scores  PHQ - 2 Score 2 4 0 2 2 0 0  PHQ- 9 Score 7 8   8        Fall Risk     12/03/2021    9:06 AM 09/01/2021   10:00 AM 10/17/2020   11:05 AM 08/28/2020   10:13 AM 08/28/2020   10:12 AM  Fall Risk   Falls in the past year? 0 0 0 0 0  Number falls in past yr: 0 0 0  0  Injury with Fall? 0 0 0  0  Risk for fall due to : No Fall Risks No Fall Risks No Fall Risks  No Fall Risks  Follow up Falls evaluation completed Falls prevention discussed Falls evaluation completed  Falls evaluation completed     Objective:  Terri Farrell seemed alert and oriented and she participated appropriately during our telephone visit.  Blood Pressure Weight BMI  BP Readings from Last 3 Encounters:  09/01/21 136/69  03/03/21 119/60  08/28/20 118/62   Wt Readings from Last 3 Encounters:  09/01/21 281 lb (127.5 kg)  03/03/21 272 lb (123.4 kg)  08/28/20 266 lb (120.7 kg)   BMI Readings from Last 1 Encounters:  09/01/21 48.23 kg/m    *Unable to obtain current vital signs, weight, and BMI due to telephone visit type  Hearing/Vision  Melodey did not seem to have difficulty with hearing/understanding during the telephone conversation Reports that she has had a formal eye exam by an eye care professional within the past year Reports that she has not had a formal hearing evaluation within the past year *Unable to fully assess hearing and vision during telephone visit type  Cognitive Function:    12/03/2021    9:15 AM 10/17/2020   11:12 AM 08/24/2018   11:18 AM  6CIT Screen  What Year? 0 points 0 points 0 points  What month? 0 points 0 points 0 points  What time? 0 points 0 points 0 points  Count back from 20 0 points 0 points 0 points  Months in reverse 0 points 0 points 0 points  Repeat phrase 2 points 2 points 2 points  Total Score 2 points 2 points 2 points   (Normal:0-7, Significant for Dysfunction: >8)  Normal Cognitive Function Screening: Yes   Immunization & Health Maintenance Record Immunization History  Administered Date(s) Administered   Fluad Quad(high Dose 65+)  03/03/2021   Influenza Split 04/11/2012   Influenza Whole 03/17/2013   Influenza, High Dose Seasonal PF 03/07/2019   Influenza,inj,Quad PF,6+ Mos 02/16/2014, 04/01/2017, 03/03/2018, 04/18/2018   Influenza-Unspecified 04/08/2015, 03/18/2016, 04/01/2017, 05/15/2020   Moderna Covid-19 Vaccine Bivalent Booster 55yrs & up 05/01/2021   Moderna SARS-COV2 Booster Vaccination 11/19/2020   Moderna Sars-Covid-2 Vaccination 08/24/2019, 09/21/2019, 05/15/2020   PNEUMOCOCCAL CONJUGATE-20 04/04/2021   Pneumococcal Polysaccharide-23 03/23/2019   Tdap 06/12/2011, 04/04/2021    Health Maintenance  Topic Date Due   COVID-19 Vaccine (5 - Moderna series) 12/19/2021 (Originally 08/29/2021)   Zoster Vaccines- Shingrix (1 of 2) 03/05/2022 (Originally 08/30/2002)   INFLUENZA VACCINE  01/13/2022   MAMMOGRAM  09/12/2023   COLONOSCOPY (Pts 45-70yrs Insurance coverage will need to be confirmed)  02/03/2027   TETANUS/TDAP  04/05/2031   Pneumonia Vaccine 48+ Years old  Completed   DEXA SCAN  Completed   Hepatitis C Screening  Completed   HPV VACCINES  Aged Out       Assessment  This is a routine wellness examination for [employer].  Health Maintenance: Due or Overdue There are no preventive care reminders to display for this patient.   Terri Farrell does not need a referral for Community Assistance: Care Management:   no Social Work:    no Prescription Assistance:  no Nutrition/Diabetes Education:  no   Plan:  Personalized Goals  Goals Addressed               This Visit's Progress     Patient Stated (pt-stated)        Patient would like to loose 25-30 lbs.       Personalized Health Maintenance & Screening Recommendations  Shingrix 2nd dose  Lung Cancer Screening Recommended: no (Low Dose CT Chest recommended if Age 76-80 years, 30 pack-year currently smoking OR have quit w/in past 15 years) Hepatitis C Screening recommended: no HIV Screening recommended: no  Advanced  Directives: Written information was not prepared per patient's request.  Referrals & Orders No orders of the defined types were placed in this encounter.   Follow-up Plan Follow-up with Agapito Games, MD as planned Schedule your second dose of shingrix vaccine at your pharmacy. Medicare wellness visit in one year. AVS printed and mailed to the patient.   I have personally reviewed and noted the following in the patient's chart:   Medical and social history Use of alcohol, tobacco or illicit drugs  Current medications and supplements Functional ability and status Nutritional status Physical activity Advanced directives List of other physicians Hospitalizations, surgeries, and ER visits in previous 12 months Vitals Screenings to include cognitive, depression, and falls Referrals and appointments  In addition, I have reviewed and discussed with Terri Farrell certain preventive protocols, quality metrics, and best practice recommendations. A written personalized care plan for preventive services as well as general preventive health recommendations is available and can be mailed to the patient at her request.      Modesto Charon, RN, BSN  12/03/2021

## 2021-12-03 NOTE — Patient Instructions (Addendum)
MEDICARE ANNUAL WELLNESS VISIT Health Maintenance Summary and Written Plan of Care  [name] ,  Thank you for allowing me to perform your Medicare Annual Wellness Visit and for your ongoing commitment to your health.   Health Maintenance & Immunization History Health Maintenance  Topic Date Due   COVID-19 Vaccine (5 - Moderna series) 12/19/2021 (Originally 08/29/2021)   Zoster Vaccines- Shingrix (1 of 2) 03/05/2022 (Originally 08/30/2002)   INFLUENZA VACCINE  01/13/2022   MAMMOGRAM  09/12/2023   COLONOSCOPY (Pts 45-42yrs Insurance coverage will need to be confirmed)  02/03/2027   TETANUS/TDAP  04/05/2031   Pneumonia Vaccine 15+ Years old  Completed   DEXA SCAN  Completed   Hepatitis C Screening  Completed   HPV VACCINES  Aged Out   Immunization History  Administered Date(s) Administered   Fluad Quad(high Dose 65+) 03/03/2021   Influenza Split 04/11/2012   Influenza Whole 03/17/2013   Influenza, High Dose Seasonal PF 03/07/2019   Influenza,inj,Quad PF,6+ Mos 02/16/2014, 04/01/2017, 03/03/2018, 04/18/2018   Influenza-Unspecified 04/08/2015, 03/18/2016, 04/01/2017, 05/15/2020   Moderna Covid-19 Vaccine Bivalent Booster 66yrs & up 05/01/2021   Moderna SARS-COV2 Booster Vaccination 11/19/2020   Moderna Sars-Covid-2 Vaccination 08/24/2019, 09/21/2019, 05/15/2020   PNEUMOCOCCAL CONJUGATE-20 04/04/2021   Pneumococcal Polysaccharide-23 03/23/2019   Tdap 06/12/2011, 04/04/2021    These are the patient goals that we discussed:  Goals Addressed               This Visit's Progress     Patient Stated (pt-stated)        Patient would like to loose 25-30 lbs.         This is a list of Health Maintenance Items that are overdue or due now: Shingrix 2nd dose  Orders/Referrals Placed Today: No orders of the defined types were placed in this encounter.  (Contact our referral department at [phone] if you have not spoken with someone about your referral appointment within  the next 5 days)    Follow-up Plan Follow-up with Agapito Games, MD as planned Schedule your second dose of shingrix vaccine at your pharmacy. Medicare wellness visit in one year. AVS printed and mailed to the patient.      Health Maintenance, Female Adopting a healthy lifestyle and getting preventive care are important in promoting health and wellness. Ask your health care provider about: The right schedule for you to have regular tests and exams. Things you can do on your own to prevent diseases and keep yourself healthy. What should I know about diet, weight, and exercise? Eat a healthy diet  Eat a diet that includes plenty of vegetables, fruits, low-fat dairy products, and lean protein. Do not eat a lot of foods that are high in solid fats, added sugars, or sodium. Maintain a healthy weight Body mass index (BMI) is used to identify weight problems. It estimates body fat based on height and weight. Your health care provider can help determine your BMI and help you achieve or maintain a healthy weight. Get regular exercise Get regular exercise. This is one of the most important things you can do for your health. Most adults should: Exercise for at least 150 minutes each week. The exercise should increase your heart rate and make you sweat (moderate-intensity exercise). Do strengthening exercises at least twice a week. This is in addition to the moderate-intensity exercise. Spend less time sitting. Even light physical activity can be beneficial. Watch cholesterol and blood lipids Have your blood tested for lipids and cholesterol at 69  years of age, then have this test every 5 years. Have your cholesterol levels checked more often if: Your lipid or cholesterol levels are high. You are older than 69 years of age. You are at high risk for heart disease. What should I know about cancer screening? Depending on your health history and family history, you may need to have cancer  screening at various ages. This may include screening for: Breast cancer. Cervical cancer. Colorectal cancer. Skin cancer. Lung cancer. What should I know about heart disease, diabetes, and high blood pressure? Blood pressure and heart disease High blood pressure causes heart disease and increases the risk of stroke. This is more likely to develop in people who have high blood pressure readings or are overweight. Have your blood pressure checked: Every 3-5 years if you are 69-10 years of age. Every year if you are 53 years old or older. Diabetes Have regular diabetes screenings. This checks your fasting blood sugar level. Have the screening done: Once every three years after age 54 if you are at a normal weight and have a low risk for diabetes. More often and at a younger age if you are overweight or have a high risk for diabetes. What should I know about preventing infection? Hepatitis B If you have a higher risk for hepatitis B, you should be screened for this virus. Talk with your health care provider to find out if you are at risk for hepatitis B infection. Hepatitis C Testing is recommended for: Everyone born from [birth year] through 1963. Anyone with known risk factors for hepatitis C. Sexually transmitted infections (STIs) Get screened for STIs, including gonorrhea and chlamydia, if: You are sexually active and are younger than 69 years of age. You are older than 69 years of age and your health care provider tells you that you are at risk for this type of infection. Your sexual activity has changed since you were last screened, and you are at increased risk for chlamydia or gonorrhea. Ask your health care provider if you are at risk. Ask your health care provider about whether you are at high risk for HIV. Your health care provider may recommend a prescription medicine to help prevent HIV infection. If you choose to take medicine to prevent HIV, you should first get tested for HIV. You  should then be tested every 3 months for as long as you are taking the medicine. Pregnancy If you are about to stop having your period (premenopausal) and you may become pregnant, seek counseling before you get pregnant. Take 400 to 800 micrograms (mcg) of folic acid every day if you become pregnant. Ask for birth control (contraception) if you want to prevent pregnancy. Osteoporosis and menopause Osteoporosis is a disease in which the bones lose minerals and strength with aging. This can result in bone fractures. If you are 36 years old or older, or if you are at risk for osteoporosis and fractures, ask your health care provider if you should: Be screened for bone loss. Take a calcium or vitamin D supplement to lower your risk of fractures. Be given hormone replacement therapy (HRT) to treat symptoms of menopause. Follow these instructions at home: Alcohol use Do not drink alcohol if: Your health care provider tells you not to drink. You are pregnant, may be pregnant, or are planning to become pregnant. If you drink alcohol: Limit how much you have to: 0-1 drink a day. Know how much alcohol is in your drink. In the [place] one drink equals  one 12 oz bottle of beer (355 mL), one 5 oz glass of wine (148 mL), or one 1 oz glass of hard liquor (44 mL). Lifestyle Do not use any products that contain nicotine or tobacco. These products include cigarettes, chewing tobacco, and vaping devices, such as e-cigarettes. If you need help quitting, ask your health care provider. Do not use street drugs. Do not share needles. Ask your health care provider for help if you need support or information about quitting drugs. General instructions Schedule regular health, dental, and eye exams. Stay current with your vaccines. Tell your health care provider if: You often feel depressed. You have ever been abused or do not feel safe at home. Summary Adopting a healthy lifestyle and getting preventive care are  important in promoting health and wellness. Follow your health care provider's instructions about healthy diet, exercising, and getting tested or screened for diseases. Follow your health care provider's instructions on monitoring your cholesterol and blood pressure. This information is not intended to replace advice given to you by your health care provider. Make sure you discuss any questions you have with your health care provider. Document Revised: 10/21/2020 Document Reviewed: 10/21/2020 Elsevier Patient Education  2023 [employer].

## 2021-12-24 DIAGNOSIS — H40023 Open angle with borderline findings, high risk, bilateral: Secondary | ICD-10-CM | POA: Diagnosis not present

## 2022-01-16 ENCOUNTER — Other Ambulatory Visit: Payer: Self-pay

## 2022-01-16 MED ORDER — FLUTICASONE PROPIONATE 50 MCG/ACT NA SUSP: NASAL | 6 refills | Status: AC

## 2022-03-04 ENCOUNTER — Encounter: Payer: Self-pay | Admitting: Family Medicine

## 2022-03-04 ENCOUNTER — Ambulatory Visit (INDEPENDENT_AMBULATORY_CARE_PROVIDER_SITE_OTHER): Payer: Medicare Other | Admitting: Family Medicine

## 2022-03-04 ENCOUNTER — Ambulatory Visit (INDEPENDENT_AMBULATORY_CARE_PROVIDER_SITE_OTHER): Payer: Medicare Other

## 2022-03-04 VITALS — BP 127/56 | HR 65 | Ht 64.0 in | Wt 278.0 lb

## 2022-03-04 DIAGNOSIS — M5441 Lumbago with sciatica, right side: Secondary | ICD-10-CM

## 2022-03-04 DIAGNOSIS — M7989 Other specified soft tissue disorders: Secondary | ICD-10-CM | POA: Diagnosis not present

## 2022-03-04 DIAGNOSIS — Z23 Encounter for immunization: Secondary | ICD-10-CM

## 2022-03-04 DIAGNOSIS — R7301 Impaired fasting glucose: Secondary | ICD-10-CM

## 2022-03-04 DIAGNOSIS — M791 Myalgia, unspecified site: Secondary | ICD-10-CM

## 2022-03-04 DIAGNOSIS — M5442 Lumbago with sciatica, left side: Secondary | ICD-10-CM | POA: Diagnosis not present

## 2022-03-04 DIAGNOSIS — I10 Essential (primary) hypertension: Secondary | ICD-10-CM | POA: Diagnosis not present

## 2022-03-04 DIAGNOSIS — R7989 Other specified abnormal findings of blood chemistry: Secondary | ICD-10-CM | POA: Diagnosis not present

## 2022-03-04 DIAGNOSIS — M792 Neuralgia and neuritis, unspecified: Secondary | ICD-10-CM | POA: Diagnosis not present

## 2022-03-04 DIAGNOSIS — M79671 Pain in right foot: Secondary | ICD-10-CM

## 2022-03-04 DIAGNOSIS — M545 Low back pain, unspecified: Secondary | ICD-10-CM | POA: Diagnosis not present

## 2022-03-04 LAB — POCT GLYCOSYLATED HEMOGLOBIN (HGB A1C): Hemoglobin A1C: 5.1 % (ref 4.0–5.6)

## 2022-03-04 NOTE — Progress Notes (Addendum)
Established Patient Office Visit  Subjective   Patient ID: Terri Farrell, female    DOB: 1951/04/25  Age: 71 y.o. MRN: [id number]  Chief Complaint  Patient presents with   Hypertension   ifg    HPI  Hypertension- Pt denies chest pain, SOB, dizziness, or heart palpitations.  Taking meds as directed w/o problems.  Denies medication side effects.    Impaired fasting glucose-no increased thirst or urination. No symptoms consistent with hypoglycemia.  She is down 3 pounds but she says she does not know how because she has been snacking a little bit more than usual.  Still complains of bilateral ankle swelling for maybe 4 to 5 months she cannot remember exactly.  But she remembers the nurse from the health insurance company coming out and mentioning that her left ankle looks swollen.  She has been having a lot more pain in her lower legs and calves over the last 1 to 2 months.  She says that they feel tender to touch sometimes and she feels like her left calf feels hard.  She has been trying to prop her feet up but she says if she tries to put them up after about 5 minutes they actually feel worse and she has more pain in her calves so she will have to lower her legs back down.  She says occasionally her pain will radiate from her calf all the way to her low back on both sides.  She is noticing the pain more so at night when she lays down in bed.  She is also been getting some tingling in her feet and lower legs that is new.  She is also had some heel pain on her right foot that is been bothersome.  No injury or trauma.  She also wonders if she could have fibromyalgia she has pain and stiffness in her upper and lower body.  She reports fatigue and excess tiredness.  She also deals with depression and anxiety as well as sleep issues.  She feels like she has difficulty concentrating at times and does have headaches.  She says sometimes she just feels really sensitive to touch even just touching her  own arm or her own leg it is very tender and sore.  She is not on any medications that should cause myalgia.     ROS    Objective:     BP (!) 127/56   Pulse 65   Ht _0  (1.626 m)   Wt 278 lb (126.1 kg)   SpO2 98%   BMI 47.72 kg/m    Physical Exam Vitals and nursing note reviewed.  Constitutional:      Appearance: She is well-developed.  HENT:     Head: Normocephalic and atraumatic.  Cardiovascular:     Rate and Rhythm: Normal rate and regular rhythm.     Heart sounds: Normal heart sounds.  Pulmonary:     Effort: Pulmonary effort is normal.     Breath sounds: Normal breath sounds.  Skin:    General: Skin is warm and dry.  Neurological:     Mental Status: She is alert and oriented to person, place, and time.  Psychiatric:        Behavior: Behavior normal.      Results for orders placed or performed in visit on 03/04/22  COMPLETE METABOLIC PANEL WITH GFR  Result Value Ref Range   Glucose, Bld 92 65 - 99 mg/dL   BUN 14 7 - 25 mg/dL  Creat 0.83 0.50 - 1.05 mg/dL   eGFR 76 > OR = 60 mL/min/1.63m   BUN/Creatinine Ratio SEE NOTE: 6 - 22 (calc)   Sodium 141 135 - 146 mmol/L   Potassium 4.4 3.5 - 5.3 mmol/L   Chloride 105 98 - 110 mmol/L   CO2 28 20 - 32 mmol/L   Calcium 10.1 8.6 - 10.4 mg/dL   Total Protein 7.7 6.1 - 8.1 g/dL   Albumin 4.4 3.6 - 5.1 g/dL   Globulin 3.3 1.9 - 3.7 g/dL (calc)   AG Ratio 1.3 1.0 - 2.5 (calc)   Total Bilirubin 0.5 0.2 - 1.2 mg/dL   Alkaline phosphatase (APISO) 52 37 - 153 U/L   AST 19 10 - 35 U/L   ALT 14 6 - 29 U/L  CBC with Differential/Platelet  Result Value Ref Range   WBC 7.0 3.8 - 10.8 Thousand/uL   RBC 4.14 3.80 - 5.10 Million/uL   Hemoglobin 13.0 11.7 - 15.5 g/dL   HCT 39.1 35.0 - 45.0 %   MCV 94.4 80.0 - 100.0 fL   MCH 31.4 27.0 - 33.0 pg   MCHC 33.2 32.0 - 36.0 g/dL   RDW 12.6 11.0 - 15.0 %   Platelets 139 (L) 140 - 400 Thousand/uL   MPV 13.9 (H) 7.5 - 12.5 fL   Neutro Abs 4,704 1,500 - 7,800 cells/uL    Lymphs Abs 1,512 850 - 3,900 cells/uL   Absolute Monocytes 420 200 - 950 cells/uL   Eosinophils Absolute 294 15 - 500 cells/uL   Basophils Absolute 70 0 - 200 cells/uL   Neutrophils Relative % 67.2 %   Total Lymphocyte 21.6 %   Monocytes Relative 6.0 %   Eosinophils Relative 4.2 %   Basophils Relative 1.0 %  TSH  Result Value Ref Range   TSH 2.42 0.40 - 4.50 mIU/L  D-Dimer, Quantitative  Result Value Ref Range   D-Dimer, Quant 0.82 (H) <0.50 mcg/mL FEU  Urinalysis, Routine w reflex microscopic  Result Value Ref Range   Color, Urine YELLOW YELLOW   APPearance CLEAR CLEAR   Specific Gravity, Urine 1.012 1.001 - 1.035   pH < OR = 5.0 5.0 - 8.0   Glucose, UA NEGATIVE NEGATIVE   Bilirubin Urine NEGATIVE NEGATIVE   Ketones, ur NEGATIVE NEGATIVE   Hgb urine dipstick NEGATIVE NEGATIVE   Protein, ur NEGATIVE NEGATIVE   Nitrite NEGATIVE NEGATIVE   Leukocytes,Ua NEGATIVE NEGATIVE  CK (Creatine Kinase)  Result Value Ref Range   Total CK 84 29 - 143 U/L  POCT glycosylated hemoglobin (Hb A1C)  Result Value Ref Range   Hemoglobin A1C 5.1 4.0 - 5.6 %   HbA1c POC (<> result, manual entry)     HbA1c, POC (prediabetic range)     HbA1c, POC (controlled diabetic range)        The 10-year ASCVD risk score (Arnett DK, et al., 2019) is: 10.7%    Assessment & Plan:   Problem List Items Addressed This Visit       Cardiovascular and Mediastinum   Essential hypertension, benign    Well controlled. Continue current regimen. Follow up in  6 mo       Relevant Orders   COMPLETE METABOLIC PANEL WITH GFR (Completed)   CBC with Differential/Platelet (Completed)   TSH (Completed)   D-Dimer, Quantitative (Completed)   Urinalysis, Routine w reflex microscopic (Completed)   CK (Creatine Kinase) (Completed)     Endocrine   IFG (impaired fasting glucose) - Primary  Well controlled. Continue current regimen. Follow up in  6 mo       Relevant Orders   POCT glycosylated hemoglobin (Hb A1C)  (Completed)   COMPLETE METABOLIC PANEL WITH GFR (Completed)   CBC with Differential/Platelet (Completed)   TSH (Completed)   D-Dimer, Quantitative (Completed)   Urinalysis, Routine w reflex microscopic (Completed)   CK (Creatine Kinase) (Completed)     Other   Pain of right heel   Myalgia    Myalgias-she like to be evaluated for possible fibromyalgia given a handout today to complete to track symptoms.  Widespread pain index score of 16.  Symptom severity score part a to a score of 9 she marked 3 for all 3 categories including fatigue, waking unrefreshed and cognitive symptoms.  Part to be score of 3.  That puts her total symptom severity score at 12.  She does technically meet diagnostic criteria for fibromyalgia assuming that we are able to rule out other potential causes.  Please see symptom score sheet scanned into the chart.      Other Visit Diagnoses     Need for immunization against influenza       Relevant Orders   Flu Vaccine QUAD High Dose(Fluad) (Completed)   Localized swelling of both lower extremities       Relevant Orders   COMPLETE METABOLIC PANEL WITH GFR (Completed)   CBC with Differential/Platelet (Completed)   TSH (Completed)   D-Dimer, Quantitative (Completed)   Urinalysis, Routine w reflex microscopic (Completed)   CK (Creatine Kinase) (Completed)   VAS [place] LOWER EXTREMITY VENOUS (DVT)   Neurogenic pain of lower extremity, unspecified laterality       Relevant Orders   COMPLETE METABOLIC PANEL WITH GFR (Completed)   CBC with Differential/Platelet (Completed)   TSH (Completed)   D-Dimer, Quantitative (Completed)   Urinalysis, Routine w reflex microscopic (Completed)   CK (Creatine Kinase) (Completed)   VAS [place] LOWER EXTREMITY VENOUS (DVT)   Acute bilateral low back pain with bilateral sciatica       Relevant Orders   COMPLETE METABOLIC PANEL WITH GFR (Completed)   CBC with Differential/Platelet (Completed)   TSH (Completed)   D-Dimer, Quantitative  (Completed)   Urinalysis, Routine w reflex microscopic (Completed)   CK (Creatine Kinase) (Completed)   DG Lumbar Spine Complete (Completed)   Elevated d-dimer       Relevant Orders   VAS [place] LOWER EXTREMITY VENOUS (DVT)       In regards to her bilateral leg pain and recent onset swelling we will do some additional labs it is unclear what is causing it in fact her weight is down so I am a little less concerned about volume overload in regards to things like heart failure etc.  Her blood pressure is actually really well controlled.  A DVT is certainly a possibility but at this point she has had symptoms for well over a month so if so would suspect that it would have gradually gotten worse.  We will do a D-dimer though to work-up further if it is positive then we will do some imaging.  Low back pain-I certainly think this could be attributing some of the lower leg pain that she is experience and she is having what sounds like radiculopathy from the lumbar spine.  We will get a plain film today for further work-up and evaluation.  Right heel pain-could refer to sports medicine for further evaluation I do think it is probably separate from the leg and back  pain that she is experiencing.  Return in about 4 weeks (around 04/01/2022) for pain in legs and back .   I spent 40 minutes on the day of the encounter to include pre-visit record review, face-to-face time with the patient and post visit ordering of test.   Beatrice Lecher, MD

## 2022-03-04 NOTE — Assessment & Plan Note (Signed)
Well controlled. Continue current regimen. Follow up in  6 mo  

## 2022-03-05 LAB — COMPLETE METABOLIC PANEL WITH GFR
AG Ratio: 1.3 (calc) (ref 1.0–2.5)
ALT: 14 U/L (ref 6–29)
AST: 19 U/L (ref 10–35)
Albumin: 4.4 g/dL (ref 3.6–5.1)
Alkaline phosphatase (APISO): 52 U/L (ref 37–153)
BUN: 14 mg/dL (ref 7–25)
CO2: 28 mmol/L (ref 20–32)
Calcium: 10.1 mg/dL (ref 8.6–10.4)
Chloride: 105 mmol/L (ref 98–110)
Creat: 0.83 mg/dL (ref 0.50–1.05)
Globulin: 3.3 g/dL (calc) (ref 1.9–3.7)
Glucose, Bld: 92 mg/dL (ref 65–99)
Potassium: 4.4 mmol/L (ref 3.5–5.3)
Sodium: 141 mmol/L (ref 135–146)
Total Bilirubin: 0.5 mg/dL (ref 0.2–1.2)
Total Protein: 7.7 g/dL (ref 6.1–8.1)
eGFR: 76 mL/min/{1.73_m2} (ref >=60)

## 2022-03-05 LAB — URINALYSIS, ROUTINE W REFLEX MICROSCOPIC
Bilirubin Urine: NEGATIVE
Glucose, UA: NEGATIVE
Hgb urine dipstick: NEGATIVE
Ketones, ur: NEGATIVE
Leukocytes,Ua: NEGATIVE
Nitrite: NEGATIVE
Protein, ur: NEGATIVE
Specific Gravity, Urine: 1.012 (ref 1.001–1.035)
pH: <=5 (ref 5.0–8.0)

## 2022-03-05 LAB — CBC WITH DIFFERENTIAL/PLATELET
Absolute Monocytes: 420 cells/uL (ref 200–950)
Basophils Absolute: 70 cells/uL (ref 0–200)
Basophils Relative: 1 %
Eosinophils Absolute: 294 cells/uL (ref 15–500)
Eosinophils Relative: 4.2 %
HCT: 39.1 % (ref 35.0–45.0)
Hemoglobin: 13 g/dL (ref 11.7–15.5)
Lymphs Abs: 1512 cells/uL (ref 850–3900)
MCH: 31.4 pg (ref 27.0–33.0)
MCHC: 33.2 g/dL (ref 32.0–36.0)
MCV: 94.4 fL (ref 80.0–100.0)
MPV: 13.9 fL — ABNORMAL HIGH (ref 7.5–12.5)
Monocytes Relative: 6 %
Neutro Abs: 4704 cells/uL (ref 1500–7800)
Neutrophils Relative %: 67.2 %
Platelets: 139 10*3/uL — ABNORMAL LOW (ref 140–400)
RBC: 4.14 10*6/uL (ref 3.80–5.10)
RDW: 12.6 % (ref 11.0–15.0)
Total Lymphocyte: 21.6 %
WBC: 7 10*3/uL (ref 3.8–10.8)

## 2022-03-05 LAB — CK: Total CK: 84 U/L (ref 29–143)

## 2022-03-05 LAB — D-DIMER, QUANTITATIVE: D-Dimer, Quant: 0.82 mcg/mL FEU — ABNORMAL HIGH (ref <0.50)

## 2022-03-05 LAB — TSH: TSH: 2.42 mIU/L (ref 0.40–4.50)

## 2022-03-05 NOTE — Progress Notes (Signed)
Call patient: All her labs look pretty good except her D-dimer was a little elevated this could indicate a blood clot but does not 100% rule it in but I would like to get a Doppler of both legs just to rule this out since she has had new onset pain and swelling.  Also her platelets are little bit low but this is not new and its not in a worrisome range.  Orders Placed This Encounter     DG Lumbar Spine Complete         Standing Status: Future         Number of Occurrences: 1         Standing Expiration Date: 03/05/2023         Order Specific Question: Reason for Exam (SYMPTOM  OR DIAGNOSIS REQUIRED)         Answer: low back pain x 1-2 months. worse at night.         Order Specific Question: Preferred imaging location?         Answer: [facility]     Flu Vaccine QUAD High Dose(Fluad)     COMPLETE METABOLIC PANEL WITH GFR     CBC with Differential/Platelet     TSH     D-Dimer, Quantitative     Urinalysis, Routine w reflex microscopic     CK (Creatine Kinase)     POCT glycosylated hemoglobin (Hb A1C)

## 2022-03-05 NOTE — Addendum Note (Signed)
Addended by: Beatrice Lecher D on: 03/05/2022 07:47 AM   Modules accepted: Orders

## 2022-03-06 ENCOUNTER — Other Ambulatory Visit: Payer: Self-pay | Admitting: Family Medicine

## 2022-03-06 DIAGNOSIS — M792 Neuralgia and neuritis, unspecified: Secondary | ICD-10-CM

## 2022-03-06 DIAGNOSIS — M5441 Lumbago with sciatica, right side: Secondary | ICD-10-CM

## 2022-03-06 NOTE — Assessment & Plan Note (Signed)
Myalgias-she like to be evaluated for possible fibromyalgia given a handout today to complete to track symptoms.  Widespread pain index score of 16.  Symptom severity score part a to a score of 9 she marked 3 for all 3 categories including fatigue, waking unrefreshed and cognitive symptoms.  Part to be score of 3.  That puts her total symptom severity score at 12.  She does technically meet diagnostic criteria for fibromyalgia assuming that we are able to rule out other potential causes.  Please see symptom score sheet scanned into the chart.

## 2022-03-06 NOTE — Progress Notes (Signed)
Orders Placed This Encounter  Procedures  . Ambulatory referral to Physical Therapy    Referral Priority:   Routine    Referral Type:   Physical Medicine    Referral Reason:   Specialty Services Required    Requested Specialty:   Physical Therapy    Number of Visits Requested:   1    

## 2022-03-06 NOTE — Addendum Note (Signed)
Addended by: Beatrice Lecher D on: 03/06/2022 08:44 AM   Modules accepted: Level of Service

## 2022-03-06 NOTE — Progress Notes (Signed)
ordered

## 2022-03-06 NOTE — Progress Notes (Signed)
PT referral placed.

## 2022-03-06 NOTE — Progress Notes (Signed)
Hi Terri Farrell, you have a fair amount of arthritis in your lumbar spine is also a little shifting of the vertebrae out of alignment called anterior listhesis at L5 on L6.  No sign of fracture which is reassuring.  I think he would be a great candidate for physical therapy for your back.  Is that something that you would be open to are interested and then please let us know and we can try to get you scheduled at our physical therapy location here in our building.  There is something closer to home or another location that you would prefer then please let us know.

## 2022-03-09 ENCOUNTER — Telehealth: Payer: Self-pay | Admitting: Family Medicine

## 2022-03-09 DIAGNOSIS — M7989 Other specified soft tissue disorders: Secondary | ICD-10-CM

## 2022-03-09 DIAGNOSIS — M5441 Lumbago with sciatica, right side: Secondary | ICD-10-CM

## 2022-03-09 NOTE — Telephone Encounter (Signed)
Original order was vascular needed to be reentered as just regular imaging.  Orders Placed This Encounter  Procedures   US Venous Img Lower Bilateral    Standing Status:   Future    Standing Expiration Date:   03/10/2023    Order Specific Question:   Reason for Exam (SYMPTOM  OR DIAGNOSIS REQUIRED)    Answer:   bilat LE swelling and pain    Order Specific Question:   Preferred imaging location?    Answer:   [facility]

## 2022-03-10 ENCOUNTER — Ambulatory Visit (INDEPENDENT_AMBULATORY_CARE_PROVIDER_SITE_OTHER): Payer: Medicare Other

## 2022-03-10 DIAGNOSIS — R600 Localized edema: Secondary | ICD-10-CM

## 2022-03-10 DIAGNOSIS — M7989 Other specified soft tissue disorders: Secondary | ICD-10-CM

## 2022-03-10 NOTE — Progress Notes (Signed)
Call patient: They did not see any evidence of a blood clot which is very reassuring.

## 2022-03-16 ENCOUNTER — Other Ambulatory Visit: Payer: Self-pay

## 2022-03-16 ENCOUNTER — Ambulatory Visit: Payer: Medicare Other | Attending: Family Medicine | Admitting: Physical Therapy

## 2022-03-16 ENCOUNTER — Encounter: Payer: Self-pay | Admitting: Physical Therapy

## 2022-03-16 DIAGNOSIS — M6281 Muscle weakness (generalized): Secondary | ICD-10-CM | POA: Diagnosis not present

## 2022-03-16 DIAGNOSIS — R262 Difficulty in walking, not elsewhere classified: Secondary | ICD-10-CM

## 2022-03-16 DIAGNOSIS — R2689 Other abnormalities of gait and mobility: Secondary | ICD-10-CM

## 2022-03-16 DIAGNOSIS — M25551 Pain in right hip: Secondary | ICD-10-CM

## 2022-03-16 DIAGNOSIS — G8929 Other chronic pain: Secondary | ICD-10-CM

## 2022-03-16 DIAGNOSIS — M25562 Pain in left knee: Secondary | ICD-10-CM | POA: Insufficient documentation

## 2022-03-16 DIAGNOSIS — M25651 Stiffness of right hip, not elsewhere classified: Secondary | ICD-10-CM

## 2022-03-16 DIAGNOSIS — M5441 Lumbago with sciatica, right side: Secondary | ICD-10-CM | POA: Diagnosis not present

## 2022-03-16 DIAGNOSIS — M5442 Lumbago with sciatica, left side: Secondary | ICD-10-CM | POA: Insufficient documentation

## 2022-03-16 NOTE — Therapy (Signed)
OUTPATIENT PHYSICAL THERAPY THORACOLUMBAR EVALUATION   Patient Name: Terri Farrell MRN: [id number] DOB:01-Feb-1951, 71 y.o., female Today's Date: 03/16/2022   PT End of Session - 03/16/22 0926     Visit Number 1    Number of Visits 16    Date for PT Re-Evaluation 05/11/22    Authorization Type [employer]    PT Start Time [phone]    PT Stop Time 1010    PT Time Calculation (min) 47 min    Activity Tolerance Patient tolerated treatment well    Behavior During Therapy [facility] for tasks assessed/performed             Past Medical History:  Diagnosis Date   Arthritis    RA   Depression    Past Surgical History:  Procedure Laterality Date   ABDOMINAL HYSTERECTOMY  06/15/1994   for fibroids   CARPAL TUNNEL RELEASE     right and left   CESAREAN SECTION     X 2   MULTIPLE TOOTH EXTRACTIONS  06/2021   had all of her teeth removed. now wears dentures   TOTAL KNEE ARTHROPLASTY  08/14/2010   Patient Active Problem List   Diagnosis Date Noted   Myalgia 03/04/2022   Pain of right heel 03/04/2022   IFG (impaired fasting glucose) 09/01/2021   Abnormal MMSE 09/01/2021   BMI 45.0-49.9, adult ([place]) 01/03/2020   Stage 3a chronic kidney disease ([place]) 05/08/2019   Paresthesia of both lower extremities 05/08/2019   Vitamin D deficiency 01/12/2018   Left knee pain 08/19/2016   Trochanteric bursitis of right hip 08/19/2016   Chronic constipation 12/25/2014   Osteoarthritis of right hip 11/21/2013   GERD (gastroesophageal reflux disease) 11/16/2013   Severe obesity (BMI >= 40) ([place]) 04/14/2013   Essential hypertension, benign 05/13/2011   Obesity 03/09/2011   Insomnia 03/09/2011   Anemia, iron deficiency 10/02/2010   Depression, major, recurrent ([place]) 07/24/2010   KNEE PAIN, RIGHT 07/24/2010   POLYARTHRITIS 07/24/2010    PCP: Hali Marry, MD   REFERRING PROVIDER: Hali Marry, MD   REFERRING DIAG: M79.2 (ICD-10-CM) - Neurogenic pain of lower extremity, unspecified  laterality M54.42,M54.41 (ICD-10-CM) - Acute bilateral low back pain with bilateral sciatica   Rationale for Evaluation and Treatment Rehabilitation  THERAPY DIAG:  Muscle weakness (generalized)  ONSET DATE: Ongoing x multiple years  SUBJECTIVE:                                                                                                                                                                                           SUBJECTIVE STATEMENT: Pt reports R hip pain, L knee pain,  and sometimes low back pain. Sometimes in the morning it's bad -- pain can shoot down the leg. Pt reports she doesn't do much at home. Pt states her walking is limited because of the hip ("It will lock up"). Pt states that the pain is affecting her more emotionally now -- feels ready to deal with it. Pt has difficulty losing the weight to get her hip replaced. Pt is currently getting a work up for fibromyalgia.  PERTINENT HISTORY:  R TKA  PAIN:  Are you having pain? Yes: NPRS scale: 6/10 Pain location: Generalized Pain description: Achy/dull Aggravating factors: Mornings, throughout the day Relieving factors: Tylenol, heat/ice, patches   PRECAUTIONS: None  WEIGHT BEARING RESTRICTIONS No  FALLS:  Has patient fallen in last 6 months? No  LIVING ENVIRONMENT: Lives with: lives alone Lives in: House/apartment Stairs: No Has following equipment at home: None  OCCUPATION: Retired  PLOF: Independent  PATIENT GOALS Less pain and get moving   OBJECTIVE:   DIAGNOSTIC FINDINGS:  Lumbar x-ray 9/20: IMPRESSION: 1. 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 2. Multilevel spondylosis and facet hypertrophy as above, greatest in the lower lumbar spine. 3. Grade 1 anterolisthesis of L5 on L6, without evidence of pars defect. 4. No acute fracture.  PATIENT SURVEYS:  FOTO 53; predicted 47  SCREENING FOR RED FLAGS: Bowel or bladder incontinence: No Spinal  tumors: No Cauda equina syndrome: No Compression fracture: No Abdominal aneurysm: No  COGNITION:  Overall cognitive status: Within functional limits for tasks assessed     SENSATION: WFL bilat a little N/T  MUSCLE LENGTH: Hamstrings: Right 55 deg; Left 45 deg  POSTURE: weight shift left and R lateral hip shift with L trunk lean  PALPATION: Pt reports general tenderness/sensitivity to all palpation  LOWER EXTREMITY ROM:     Passive  Right eval Left eval  Hip flexion 100 100  Hip extension 5 10  Hip abduction 20 35  Hip adduction    Hip internal rotation    Hip external rotation    Knee flexion    Knee extension    Ankle dorsiflexion    Ankle plantarflexion    Ankle inversion    Ankle eversion     (Blank rows = not tested)  LOWER EXTREMITY MMT:    MMT Right eval Left eval  Hip flexion 4+ 4+  Hip extension 3- 3-  Hip abduction 3+ 3+  Hip adduction 4 4  Hip internal rotation    Hip external rotation    Knee flexion 4 4  Knee extension 3+ 3+  Ankle dorsiflexion    Ankle plantarflexion    Ankle inversion    Ankle eversion     (Blank rows = not tested)  LUMBAR SPECIAL TESTS:  Straight leg raise test: Negative and Slump test: Negative  FUNCTIONAL TESTS:  5 times sit to stand: 16 sec Timed up and go (TUG): 13.52 sec  GAIT: Distance walked: 150 Assistive device utilized: None Level of assistance: SBA Comments: Trunk flexion with R LE weight bearing, R knee remains mostly extended, antalgic    TODAY'S TREATMENT  03/16/22 THEREX Seated  Knee ext red TB 2x10 bilat  Knee flex red TB 2x10 bilat  Hamstring stretch x 30 sec bilat  Marching x10 Standing  Glute set at counter 10x3 sec   PATIENT EDUCATION:  Education details: Exam findings, POC, HEP Person educated: Patient Education method: Explanation, Demonstration, and Handouts Education comprehension: verbalized understanding, returned demonstration, and needs further education  HOME  EXERCISE PROGRAM: Access Code: [email or web address] URL: [email or web address] Date: 03/16/2022 Prepared by: Estill Bamberg April Thurnell Garbe  Exercises - Seated Hamstring Stretch  - 1 x daily - 7 x weekly - 2 sets - 30 sec hold - Seated March  - 1 x daily - 7 x weekly - 2 sets - 10 reps - Seated Knee Extension with Resistance  - 1 x daily - 7 x weekly - 2 sets - 10 reps - Seated Hamstring Curls with Resistance  - 1 x daily - 7 x weekly - 2 sets - 10 reps - Standing Gluteal Sets  - 1 x daily - 7 x weekly - 2 sets - 10 reps - 3 sec hold  ASSESSMENT:  CLINICAL IMPRESSION: Patient is a 71 y.o. F who was seen today for physical therapy evaluation and treatment for generalized pain. Pt centralizes pain to R hip, L knee, and back. Assessment significant for postural abnormalities, antalgic gait, generalized decreased strength and decreased R hip ROM. Pt would benefit from PT to address these issues to maximize her level of function.    OBJECTIVE IMPAIRMENTS Abnormal gait, decreased activity tolerance, decreased endurance, decreased mobility, difficulty walking, decreased ROM, decreased strength, hypomobility, postural dysfunction, and pain.   ACTIVITY LIMITATIONS lifting, bending, sitting, standing, squatting, sleeping, transfers, bed mobility, toileting, hygiene/grooming, and locomotion level  PARTICIPATION LIMITATIONS: cleaning, laundry, shopping, and community activity  PERSONAL FACTORS Age, Fitness, Past/current experiences, and Time since onset of injury/illness/exacerbation are also affecting patient's functional outcome.   REHAB POTENTIAL: Good  CLINICAL DECISION MAKING: Evolving/moderate complexity  EVALUATION COMPLEXITY: Moderate   GOALS: Goals reviewed with patient? Yes  SHORT TERM GOALS: Target date: 04/13/2022  Pt will be ind with initial HEP Baseline: Goal status: INITIAL  2.  Pt will demo improved postural alignment without PT cueing Baseline:  Goal status:  INITIAL  3.  Pt will be able to perform 5x STS in </=13 sec to demo improving functional strength Baseline:  Goal status: INITIAL   LONG TERM GOALS: Target date: 05/11/2022  Pt will be ind with maintaining and progressing HEP at home Baseline:  Goal status: INITIAL  2.  Pt will demo at least 4/5 bilat LE strength for improved home function Baseline:  Goal status: INITIAL  3.  Pt will be able to demo improved hip ROM to be able to put on socks/shoes in seated Baseline:  Goal status: INITIAL  4.  Pt will be able to demo equal L and R LE weightshift with </=4/10 pain Baseline:  Goal status: INITIAL  5.  Pt will report decrease in overall pain to </=4/10 with activities Baseline:  Goal status: INITIAL  6.  Pt will have improved FOTO to >/=68 Baseline:  Goal status: INITIAL   PLAN: PT FREQUENCY: 2x/week  PT DURATION: 8 weeks  PLANNED INTERVENTIONS: Therapeutic exercises, Therapeutic activity, Neuromuscular re-education, Balance training, Gait training, Patient/Family education, Self Care, Joint mobilization, Aquatic Therapy, Electrical stimulation, Spinal mobilization, Cryotherapy, Moist heat, Taping, Ionotophoresis 4mg /ml Dexamethasone, Manual therapy, and Re-evaluation.  PLAN FOR NEXT SESSION: Assess response to HEP. Continue to work on postural alignment and bilat LE strengthening. Initiate aerobics.    Mia Milan April Ma L Hikari Tripp, PT, DPT 03/16/2022, 10:19 AM

## 2022-03-17 ENCOUNTER — Other Ambulatory Visit: Payer: Self-pay | Admitting: Family Medicine

## 2022-03-18 ENCOUNTER — Ambulatory Visit: Payer: Medicare Other | Admitting: Physical Therapy

## 2022-03-18 ENCOUNTER — Encounter: Payer: Self-pay | Admitting: Physical Therapy

## 2022-03-18 DIAGNOSIS — M5441 Lumbago with sciatica, right side: Secondary | ICD-10-CM | POA: Diagnosis not present

## 2022-03-18 DIAGNOSIS — M6281 Muscle weakness (generalized): Secondary | ICD-10-CM

## 2022-03-18 DIAGNOSIS — M25551 Pain in right hip: Secondary | ICD-10-CM | POA: Diagnosis not present

## 2022-03-18 DIAGNOSIS — R2689 Other abnormalities of gait and mobility: Secondary | ICD-10-CM | POA: Diagnosis not present

## 2022-03-18 DIAGNOSIS — M25562 Pain in left knee: Secondary | ICD-10-CM | POA: Diagnosis not present

## 2022-03-18 DIAGNOSIS — R262 Difficulty in walking, not elsewhere classified: Secondary | ICD-10-CM | POA: Diagnosis not present

## 2022-03-18 DIAGNOSIS — M5442 Lumbago with sciatica, left side: Secondary | ICD-10-CM | POA: Diagnosis not present

## 2022-03-18 DIAGNOSIS — G8929 Other chronic pain: Secondary | ICD-10-CM | POA: Diagnosis not present

## 2022-03-18 DIAGNOSIS — M25651 Stiffness of right hip, not elsewhere classified: Secondary | ICD-10-CM | POA: Diagnosis not present

## 2022-03-18 NOTE — Therapy (Signed)
OUTPATIENT PHYSICAL THERAPY    Patient Name: Terri Farrell MRN: [id number] DOB:07-Oct-1950, 71 y.o., female Today's Date: 03/18/2022   PT End of Session - 03/18/22 1007     Visit Number 2    Number of Visits 16    Date for PT Re-Evaluation 05/11/22    Authorization - Visit Number 2    Progress Note Due on Visit 10    PT Start Time 0930    PT Stop Time 1008    PT Time Calculation (min) 38 min    Activity Tolerance Patient tolerated treatment well    Behavior During Therapy WFL for tasks assessed/performed              Past Medical History:  Diagnosis Date   Arthritis    RA   Depression    Past Surgical History:  Procedure Laterality Date   ABDOMINAL HYSTERECTOMY  06/15/1994   for fibroids   CARPAL TUNNEL RELEASE     right and left   CESAREAN SECTION     X 2   MULTIPLE TOOTH EXTRACTIONS  06/2021   had all of her teeth removed. now wears dentures   TOTAL KNEE ARTHROPLASTY  08/14/2010   Patient Active Problem List   Diagnosis Date Noted   Myalgia 03/04/2022   Pain of right heel 03/04/2022   IFG (impaired fasting glucose) 09/01/2021   Abnormal MMSE 09/01/2021   BMI 45.0-49.9, adult ([place]) 01/03/2020   Stage 3a chronic kidney disease ([place]) 05/08/2019   Paresthesia of both lower extremities 05/08/2019   Vitamin D deficiency 01/12/2018   Left knee pain 08/19/2016   Trochanteric bursitis of right hip 08/19/2016   Chronic constipation 12/25/2014   Osteoarthritis of right hip 11/21/2013   GERD (gastroesophageal reflux disease) 11/16/2013   Severe obesity (BMI >= 40) ([place]) 04/14/2013   Essential hypertension, benign 05/13/2011   Obesity 03/09/2011   Insomnia 03/09/2011   Anemia, iron deficiency 10/02/2010   Depression, major, recurrent ([place]) 07/24/2010   KNEE PAIN, RIGHT 07/24/2010   POLYARTHRITIS 07/24/2010    PCP: Hali Marry, MD   REFERRING PROVIDER: Hali Marry, MD   REFERRING DIAG: M79.2 (ICD-10-CM) - Neurogenic pain of lower  extremity, unspecified laterality M54.42,M54.41 (ICD-10-CM) - Acute bilateral low back pain with bilateral sciatica   Rationale for Evaluation and Treatment Rehabilitation  THERAPY DIAG:  Muscle weakness (generalized)  Other abnormalities of gait and mobility  ONSET DATE: Ongoing x multiple years  SUBJECTIVE:  SUBJECTIVE STATEMENT: Pt states she wakes up with pain every morning in her hips and knees. Pain is worse in the morning and gets better throughout the day  PERTINENT HISTORY:  R TKA  PAIN:  Are you having pain? Yes: NPRS scale: 5/10 Pain location: hips and knees Pain description: Achy/dull Aggravating factors: Mornings, throughout the day Relieving factors: Tylenol, heat/ice, patches   PRECAUTIONS: None  PATIENT GOALS Less pain and get moving   OBJECTIVE:   LOWER EXTREMITY ROM:     Passive  Right eval Left eval  Hip flexion 100 100  Hip extension 5 10  Hip abduction 20 35  Hip adduction    Hip internal rotation    Hip external rotation    Knee flexion    Knee extension    Ankle dorsiflexion    Ankle plantarflexion    Ankle inversion    Ankle eversion     (Blank rows = not tested)  LOWER EXTREMITY MMT:    MMT Right eval Left eval  Hip flexion 4+ 4+  Hip extension 3- 3-  Hip abduction 3+ 3+  Hip adduction 4 4  Hip internal rotation    Hip external rotation    Knee flexion 4 4  Knee extension 3+ 3+  Ankle dorsiflexion    Ankle plantarflexion    Ankle inversion    Ankle eversion     (Blank rows = not tested)  FUNCTIONAL TESTS:  5 times sit to stand: 16 sec Timed up and go (TUG): 13.52 sec   TODAY'S TREATMENT  03/18/22 Nustep L5 x 5 mins for warm up  Seated: Knee ext x 20 red TB Knee flex x 20 red TB Marching x 20 Sit <> stand 3 x  5  Standing: Heel raise x 10 Glute squeeze x 10 Hip abd x 10 bilat Hip ext x 10 bilat HS curl x 10 bilat Semi tandem on foam 2 x 30 sec bilat Backward walking and side stepping at counter  03/16/22 THEREX Seated  Knee ext red TB 2x10 bilat  Knee flex red TB 2x10 bilat  Hamstring stretch x 30 sec bilat  Marching x10 Standing  Glute set at counter 10x3 sec   PATIENT EDUCATION:  Education details: Exam findings, POC, HEP Person educated: Patient Education method: Explanation, Demonstration, and Handouts Education comprehension: verbalized understanding, returned demonstration, and needs further education   HOME EXERCISE PROGRAM: Access Code: [email or web address] URL: [email or web address] Date: 03/16/2022 Prepared by: Estill Bamberg April Thurnell Garbe  Exercises - Seated Hamstring Stretch  - 1 x daily - 7 x weekly - 2 sets - 30 sec hold - Seated March  - 1 x daily - 7 x weekly - 2 sets - 10 reps - Seated Knee Extension with Resistance  - 1 x daily - 7 x weekly - 2 sets - 10 reps - Seated Hamstring Curls with Resistance  - 1 x daily - 7 x weekly - 2 sets - 10 reps - Standing Gluteal Sets  - 1 x daily - 7 x weekly - 2 sets - 10 reps - 3 sec hold  ASSESSMENT:  CLINICAL IMPRESSION: Good tolerance to exercise progression in standing. She will continue to benefit from strength, balance and endurance training   GOALS: Goals reviewed with patient? Yes  SHORT TERM GOALS: Target date: 04/13/2022  Pt will be ind with initial HEP Baseline: Goal status: INITIAL  2.  Pt will demo improved postural alignment without PT cueing Baseline:  Goal status: INITIAL  3.  Pt will be able to perform 5x STS in </=13 sec to demo improving functional strength Baseline:  Goal status: INITIAL   LONG TERM GOALS: Target date: 05/11/2022  Pt will be ind with maintaining and progressing HEP at home Baseline:  Goal status: INITIAL  2.  Pt will demo at least 4/5 bilat LE strength for  improved home function Baseline:  Goal status: INITIAL  3.  Pt will be able to demo improved hip ROM to be able to put on socks/shoes in seated Baseline:  Goal status: INITIAL  4.  Pt will be able to demo equal L and R LE weightshift with </=4/10 pain Baseline:  Goal status: INITIAL  5.  Pt will report decrease in overall pain to </=4/10 with activities Baseline:  Goal status: INITIAL  6.  Pt will have improved FOTO to >/=68 Baseline:  Goal status: INITIAL   PLAN: PT FREQUENCY: 2x/week  PT DURATION: 8 weeks  PLANNED INTERVENTIONS: Therapeutic exercises, Therapeutic activity, Neuromuscular re-education, Balance training, Gait training, Patient/Family education, Self Care, Joint mobilization, Aquatic Therapy, Electrical stimulation, Spinal mobilization, Cryotherapy, Moist heat, Taping, Ionotophoresis 4mg /ml Dexamethasone, Manual therapy, and Re-evaluation.  PLAN FOR NEXT SESSION: Continue to work on postural alignment and bilat LE strengthening, endurance, balance    Krue Peterka, PT 03/18/2022, 10:08 AM

## 2022-03-23 ENCOUNTER — Ambulatory Visit: Payer: Medicare Other | Admitting: Physical Therapy

## 2022-03-23 ENCOUNTER — Encounter: Payer: Self-pay | Admitting: Physical Therapy

## 2022-03-23 DIAGNOSIS — M6281 Muscle weakness (generalized): Secondary | ICD-10-CM | POA: Diagnosis not present

## 2022-03-23 DIAGNOSIS — R2689 Other abnormalities of gait and mobility: Secondary | ICD-10-CM | POA: Diagnosis not present

## 2022-03-23 DIAGNOSIS — M25651 Stiffness of right hip, not elsewhere classified: Secondary | ICD-10-CM | POA: Diagnosis not present

## 2022-03-23 DIAGNOSIS — M25551 Pain in right hip: Secondary | ICD-10-CM | POA: Diagnosis not present

## 2022-03-23 DIAGNOSIS — R262 Difficulty in walking, not elsewhere classified: Secondary | ICD-10-CM | POA: Diagnosis not present

## 2022-03-23 DIAGNOSIS — M5441 Lumbago with sciatica, right side: Secondary | ICD-10-CM | POA: Diagnosis not present

## 2022-03-23 DIAGNOSIS — M5442 Lumbago with sciatica, left side: Secondary | ICD-10-CM | POA: Diagnosis not present

## 2022-03-23 DIAGNOSIS — M25562 Pain in left knee: Secondary | ICD-10-CM | POA: Diagnosis not present

## 2022-03-23 DIAGNOSIS — G8929 Other chronic pain: Secondary | ICD-10-CM | POA: Diagnosis not present

## 2022-03-23 NOTE — Therapy (Signed)
OUTPATIENT PHYSICAL THERAPY    Patient Name: Terri Farrell MRN: [id number] DOB:1951-04-30, 71 y.o., female Today's Date: 03/23/2022   PT End of Session - 03/23/22 0959     Visit Number 3    Number of Visits 16    Date for PT Re-Evaluation 05/11/22    Authorization - Visit Number 3    Progress Note Due on Visit 10    PT Start Time 0923    PT Stop Time 1001    PT Time Calculation (min) 38 min    Activity Tolerance Patient tolerated treatment well    Behavior During Therapy [facility] for tasks assessed/performed               Past Medical History:  Diagnosis Date   Arthritis    RA   Depression    Past Surgical History:  Procedure Laterality Date   ABDOMINAL HYSTERECTOMY  06/15/1994   for fibroids   CARPAL TUNNEL RELEASE     right and left   CESAREAN SECTION     X 2   MULTIPLE TOOTH EXTRACTIONS  06/2021   had all of her teeth removed. now wears dentures   TOTAL KNEE ARTHROPLASTY  08/14/2010   Patient Active Problem List   Diagnosis Date Noted   Myalgia 03/04/2022   Pain of right heel 03/04/2022   IFG (impaired fasting glucose) 09/01/2021   Abnormal MMSE 09/01/2021   BMI 45.0-49.9, adult ([place]) 01/03/2020   Stage 3a chronic kidney disease ([place]) 05/08/2019   Paresthesia of both lower extremities 05/08/2019   Vitamin D deficiency 01/12/2018   Left knee pain 08/19/2016   Trochanteric bursitis of right hip 08/19/2016   Chronic constipation 12/25/2014   Osteoarthritis of right hip 11/21/2013   GERD (gastroesophageal reflux disease) 11/16/2013   Severe obesity (BMI >= 40) ([place]) 04/14/2013   Essential hypertension, benign 05/13/2011   Obesity 03/09/2011   Insomnia 03/09/2011   Anemia, iron deficiency 10/02/2010   Depression, major, recurrent ([place]) 07/24/2010   KNEE PAIN, RIGHT 07/24/2010   POLYARTHRITIS 07/24/2010    PCP: Hali Marry, MD   REFERRING PROVIDER: Hali Marry, MD   REFERRING DIAG: M79.2 (ICD-10-CM) - Neurogenic pain of lower  extremity, unspecified laterality M54.42,M54.41 (ICD-10-CM) - Acute bilateral low back pain with bilateral sciatica   Rationale for Evaluation and Treatment Rehabilitation  THERAPY DIAG:  Muscle weakness (generalized)  Other abnormalities of gait and mobility  Pain in right hip  Chronic pain of left knee  Stiffness of right hip, not elsewhere classified  ONSET DATE: Ongoing x multiple years  SUBJECTIVE:  SUBJECTIVE STATEMENT: Pt states she had a lot of pain yesterday after church, but is feeling better this morning  PERTINENT HISTORY:  R TKA  PAIN:  Are you having pain? Yes: NPRS scale: 4/10 Pain location: hips and knees Pain description: Achy/dull Aggravating factors: Mornings, throughout the day Relieving factors: Tylenol, heat/ice, patches   PRECAUTIONS: None  PATIENT GOALS Less pain and get moving   OBJECTIVE:   LOWER EXTREMITY ROM:     Passive  Right eval Left eval  Hip flexion 100 100  Hip extension 5 10  Hip abduction 20 35  Hip adduction    Hip internal rotation    Hip external rotation    Knee flexion    Knee extension    Ankle dorsiflexion    Ankle plantarflexion    Ankle inversion    Ankle eversion     (Blank rows = not tested)  LOWER EXTREMITY MMT:    MMT Right eval Left eval  Hip flexion 4+ 4+  Hip extension 3- 3-  Hip abduction 3+ 3+  Hip adduction 4 4  Hip internal rotation    Hip external rotation    Knee flexion 4 4  Knee extension 3+ 3+  Ankle dorsiflexion    Ankle plantarflexion    Ankle inversion    Ankle eversion     (Blank rows = not tested)  FUNCTIONAL TESTS:  5 times sit to stand: 16 sec (eval) Timed up and go (TUG): 13.52 sec    5x STS 14.95 (10/9)   TUG 12.98 (10/9)  TODAY'S TREATMENT  03/23/22 Nustep L5 x 5 mins for warm  up  Standing: Heel raise x 12 Hip abd x 12 bilat Hip ext x 12 bilat HS curl x 12 bilat Tandem stance on foam 2 x 30 sec bilat     Rocker board x 1 min each A/P and laterally, light hand hold  Sit <> stand 3 x 5 Knee ext 2 x 10 bilat red TB Knee flex 2 x 10 bilat red TB Marching x 20  03/18/22 Nustep L5 x 5 mins for warm up  Seated: Knee ext x 20 red TB Knee flex x 20 red TB Marching x 20 Sit <> stand 3 x 5  Standing: Heel raise x 10 Glute squeeze x 10 Hip abd x 10 bilat Hip ext x 10 bilat HS curl x 10 bilat Semi tandem on foam 2 x 30 sec bilat Backward walking and side stepping at counter  03/16/22 THEREX Seated  Knee ext red TB 2x10 bilat  Knee flex red TB 2x10 bilat  Hamstring stretch x 30 sec bilat  Marching x10 Standing  Glute set at counter 10x3 sec   PATIENT EDUCATION:  Education details: Exam findings, POC, HEP Person educated: Patient Education method: Explanation, Demonstration, and Handouts Education comprehension: verbalized understanding, returned demonstration, and needs further education   HOME EXERCISE PROGRAM: Access Code: [email or web address] URL: [email or web address] Date: 03/16/2022 Prepared by: Estill Bamberg April Thurnell Garbe  Exercises - Seated Hamstring Stretch  - 1 x daily - 7 x weekly - 2 sets - 30 sec hold - Seated March  - 1 x daily - 7 x weekly - 2 sets - 10 reps - Seated Knee Extension with Resistance  - 1 x daily - 7 x weekly - 2 sets - 10 reps - Seated Hamstring Curls with Resistance  - 1 x daily - 7 x weekly - 2 sets - 10 reps - Standing Gluteal Sets  - 1 x  daily - 7 x weekly - 2 sets - 10 reps - 3 sec hold  ASSESSMENT:  CLINICAL IMPRESSION: Pt making improvements in TUG and 5x STS. Improving tolerance to standing exercises   GOALS: Goals reviewed with patient? Yes  SHORT TERM GOALS: Target date: 04/13/2022  Pt will be ind with initial HEP Baseline: Goal status: INITIAL  2.  Pt will demo improved postural  alignment without PT cueing Baseline:  Goal status: INITIAL  3.  Pt will be able to perform 5x STS in </=13 sec to demo improving functional strength Baseline:  Goal status: INITIAL   LONG TERM GOALS: Target date: 05/11/2022  Pt will be ind with maintaining and progressing HEP at home Baseline:  Goal status: INITIAL  2.  Pt will demo at least 4/5 bilat LE strength for improved home function Baseline:  Goal status: INITIAL  3.  Pt will be able to demo improved hip ROM to be able to put on socks/shoes in seated Baseline:  Goal status: INITIAL  4.  Pt will be able to demo equal L and R LE weightshift with </=4/10 pain Baseline:  Goal status: INITIAL  5.  Pt will report decrease in overall pain to </=4/10 with activities Baseline:  Goal status: INITIAL  6.  Pt will have improved FOTO to >/=68 Baseline:  Goal status: INITIAL   PLAN: PT FREQUENCY: 2x/week  PT DURATION: 8 weeks  PLANNED INTERVENTIONS: Therapeutic exercises, Therapeutic activity, Neuromuscular re-education, Balance training, Gait training, Patient/Family education, Self Care, Joint mobilization, Aquatic Therapy, Electrical stimulation, Spinal mobilization, Cryotherapy, Moist heat, Taping, Ionotophoresis 4mg /ml Dexamethasone, Manual therapy, and Re-evaluation.  PLAN FOR NEXT SESSION: Continue to work on postural alignment and bilat LE strengthening, endurance, balance    Carolyn Maniscalco, PT 03/23/2022, 10:00 AM

## 2022-03-25 ENCOUNTER — Encounter: Payer: Self-pay | Admitting: Physical Therapy

## 2022-03-25 ENCOUNTER — Ambulatory Visit: Payer: Medicare Other | Admitting: Physical Therapy

## 2022-03-25 DIAGNOSIS — M25551 Pain in right hip: Secondary | ICD-10-CM

## 2022-03-25 DIAGNOSIS — G8929 Other chronic pain: Secondary | ICD-10-CM | POA: Diagnosis not present

## 2022-03-25 DIAGNOSIS — M25651 Stiffness of right hip, not elsewhere classified: Secondary | ICD-10-CM

## 2022-03-25 DIAGNOSIS — R262 Difficulty in walking, not elsewhere classified: Secondary | ICD-10-CM

## 2022-03-25 DIAGNOSIS — M25562 Pain in left knee: Secondary | ICD-10-CM | POA: Diagnosis not present

## 2022-03-25 DIAGNOSIS — R2689 Other abnormalities of gait and mobility: Secondary | ICD-10-CM | POA: Diagnosis not present

## 2022-03-25 DIAGNOSIS — M5442 Lumbago with sciatica, left side: Secondary | ICD-10-CM | POA: Diagnosis not present

## 2022-03-25 DIAGNOSIS — M6281 Muscle weakness (generalized): Secondary | ICD-10-CM | POA: Diagnosis not present

## 2022-03-25 DIAGNOSIS — M5441 Lumbago with sciatica, right side: Secondary | ICD-10-CM | POA: Diagnosis not present

## 2022-03-25 NOTE — Therapy (Signed)
OUTPATIENT PHYSICAL THERAPY    Patient Name: Terri Farrell MRN: [id number] DOB:08-15-1950, 71 y.o., female Today's Date: 03/25/2022   PT End of Session - 03/25/22 0935     Visit Number 4    Number of Visits 16    Date for PT Re-Evaluation 05/11/22    Authorization - Visit Number 4    Progress Note Due on Visit 10    PT Start Time 0935    PT Stop Time 1015    PT Time Calculation (min) 40 min    Activity Tolerance Patient tolerated treatment well    Behavior During Therapy [facility] for tasks assessed/performed               Past Medical History:  Diagnosis Date   Arthritis    RA   Depression    Past Surgical History:  Procedure Laterality Date   ABDOMINAL HYSTERECTOMY  06/15/1994   for fibroids   CARPAL TUNNEL RELEASE     right and left   CESAREAN SECTION     X 2   MULTIPLE TOOTH EXTRACTIONS  06/2021   had all of her teeth removed. now wears dentures   TOTAL KNEE ARTHROPLASTY  08/14/2010   Patient Active Problem List   Diagnosis Date Noted   Myalgia 03/04/2022   Pain of right heel 03/04/2022   IFG (impaired fasting glucose) 09/01/2021   Abnormal MMSE 09/01/2021   BMI 45.0-49.9, adult ([place]) 01/03/2020   Stage 3a chronic kidney disease ([place]) 05/08/2019   Paresthesia of both lower extremities 05/08/2019   Vitamin D deficiency 01/12/2018   Left knee pain 08/19/2016   Trochanteric bursitis of right hip 08/19/2016   Chronic constipation 12/25/2014   Osteoarthritis of right hip 11/21/2013   GERD (gastroesophageal reflux disease) 11/16/2013   Severe obesity (BMI >= 40) ([place]) 04/14/2013   Essential hypertension, benign 05/13/2011   Obesity 03/09/2011   Insomnia 03/09/2011   Anemia, iron deficiency 10/02/2010   Depression, major, recurrent ([place]) 07/24/2010   KNEE PAIN, RIGHT 07/24/2010   POLYARTHRITIS 07/24/2010    PCP: Hali Marry, MD   REFERRING PROVIDER: Hali Marry, MD   REFERRING DIAG: M79.2 (ICD-10-CM) - Neurogenic pain of lower  extremity, unspecified laterality M54.42,M54.41 (ICD-10-CM) - Acute bilateral low back pain with bilateral sciatica   Rationale for Evaluation and Treatment Rehabilitation  THERAPY DIAG:  Muscle weakness (generalized)  Other abnormalities of gait and mobility  Pain in right hip  Chronic pain of left knee  Stiffness of right hip, not elsewhere classified  Difficulty in walking, not elsewhere classified  ONSET DATE: Ongoing x multiple years  SUBJECTIVE:  SUBJECTIVE STATEMENT: Pt states she is going to get her flu shot and RSV shot tomorrow. Pt states she has not been consistent with her exercises at home.   PERTINENT HISTORY:  R TKA  PAIN:  Are you having pain? Yes: NPRS scale: 5/10 Pain location: hips and knees Pain description: Achy/dull Aggravating factors: Mornings, throughout the day Relieving factors: Tylenol, heat/ice, patches   PRECAUTIONS: None  PATIENT GOALS Less pain and get moving   OBJECTIVE:   LOWER EXTREMITY ROM:     Passive  Right eval Left eval  Hip flexion 100 100  Hip extension 5 10  Hip abduction 20 35  Hip adduction    Hip internal rotation    Hip external rotation    Knee flexion    Knee extension    Ankle dorsiflexion    Ankle plantarflexion    Ankle inversion    Ankle eversion     (Blank rows = not tested)  LOWER EXTREMITY MMT:    MMT Right eval Left eval  Hip flexion 4+ 4+  Hip extension 3- 3-  Hip abduction 3+ 3+  Hip adduction 4 4  Hip internal rotation    Hip external rotation    Knee flexion 4 4  Knee extension 3+ 3+  Ankle dorsiflexion    Ankle plantarflexion    Ankle inversion    Ankle eversion     (Blank rows = not tested)  FUNCTIONAL TESTS:  5 times sit to stand: 16 sec (eval) Timed up and go (TUG): 13.52 sec    5x STS  14.95 (10/9)   TUG 12.98 (10/9)  TODAY'S TREATMENT  03/25/22 Nustep L5 x 5 min for warm up  Seated Hamstring stretch 2x30 sec  Supine SKTC 2x30 sec R&L Clamshell red TB 2x10 Marching red TB x10 Straight leg raise 2x10 SAQ red TB 2x10  Standing Hands on wall with hip ext toe touch 2x10 Hands on wall glute set 2x10 L lateral hip shift 2x10 Heel raise 2x10  03/23/22 Nustep L5 x 5 mins for warm up  Standing: Heel raise x 12 Hip abd x 12 bilat Hip ext x 12 bilat HS curl x 12 bilat Tandem stance on foam 2 x 30 sec bilat     Rocker board x 1 min each A/P and laterally, light hand hold  Sit <> stand 3 x 5 Knee ext 2 x 10 bilat red TB Knee flex 2 x 10 bilat red TB Marching x 20   03/18/22 Nustep L5 x 5 mins for warm up  Seated: Knee ext x 20 red TB Knee flex x 20 red TB Marching x 20 Sit <> stand 3 x 5  Standing: Heel raise x 10 Glute squeeze x 10 Hip abd x 10 bilat Hip ext x 10 bilat HS curl x 10 bilat Semi tandem on foam 2 x 30 sec bilat Backward walking and side stepping at counter    PATIENT EDUCATION:  Education details: Exam findings, POC, HEP Person educated: Patient Education method: Explanation, Demonstration, and Handouts Education comprehension: verbalized understanding, returned demonstration, and needs further education   HOME EXERCISE PROGRAM: Access Code: [email or web address] URL: [email or web address] Date: 03/16/2022 Prepared by: Estill Bamberg April Thurnell Garbe  Exercises - Seated Hamstring Stretch  - 1 x daily - 7 x weekly - 2 sets - 30 sec hold - Seated March  - 1 x daily - 7 x weekly - 2 sets - 10 reps - Seated Knee Extension with Resistance  - 1 x daily -  7 x weekly - 2 sets - 10 reps - Seated Hamstring Curls with Resistance  - 1 x daily - 7 x weekly - 2 sets - 10 reps - Standing Gluteal Sets  - 1 x daily - 7 x weekly - 2 sets - 10 reps - 3 sec hold  ASSESSMENT:  CLINICAL IMPRESSION: Focused on progressing hip and knee  strengthening exercises with resistance. R hip remains tighter than L.   GOALS: Goals reviewed with patient? Yes  SHORT TERM GOALS: Target date: 04/13/2022  Pt will be ind with initial HEP Baseline: Goal status: INITIAL  2.  Pt will demo improved postural alignment without PT cueing Baseline:  Goal status: INITIAL  3.  Pt will be able to perform 5x STS in </=13 sec to demo improving functional strength Baseline:  Goal status: INITIAL   LONG TERM GOALS: Target date: 05/11/2022  Pt will be ind with maintaining and progressing HEP at home Baseline:  Goal status: INITIAL  2.  Pt will demo at least 4/5 bilat LE strength for improved home function Baseline:  Goal status: INITIAL  3.  Pt will be able to demo improved hip ROM to be able to put on socks/shoes in seated Baseline:  Goal status: INITIAL  4.  Pt will be able to demo equal L and R LE weightshift with </=4/10 pain Baseline:  Goal status: INITIAL  5.  Pt will report decrease in overall pain to </=4/10 with activities Baseline:  Goal status: INITIAL  6.  Pt will have improved FOTO to >/=68 Baseline:  Goal status: INITIAL   PLAN: PT FREQUENCY: 2x/week  PT DURATION: 8 weeks  PLANNED INTERVENTIONS: Therapeutic exercises, Therapeutic activity, Neuromuscular re-education, Balance training, Gait training, Patient/Family education, Self Care, Joint mobilization, Aquatic Therapy, Electrical stimulation, Spinal mobilization, Cryotherapy, Moist heat, Taping, Ionotophoresis 4mg /ml Dexamethasone, Manual therapy, and Re-evaluation.  PLAN FOR NEXT SESSION: Continue to work on postural alignment and bilat LE strengthening, endurance, balance    Dmarco Baldus April Ma L Trena Dunavan, PT 03/25/2022, 9:37 AM

## 2022-03-30 ENCOUNTER — Encounter: Payer: Self-pay | Admitting: Physical Therapy

## 2022-03-30 ENCOUNTER — Ambulatory Visit: Payer: Medicare Other | Admitting: Physical Therapy

## 2022-03-30 DIAGNOSIS — M25551 Pain in right hip: Secondary | ICD-10-CM

## 2022-03-30 DIAGNOSIS — R262 Difficulty in walking, not elsewhere classified: Secondary | ICD-10-CM

## 2022-03-30 DIAGNOSIS — M25651 Stiffness of right hip, not elsewhere classified: Secondary | ICD-10-CM | POA: Diagnosis not present

## 2022-03-30 DIAGNOSIS — R2689 Other abnormalities of gait and mobility: Secondary | ICD-10-CM | POA: Diagnosis not present

## 2022-03-30 DIAGNOSIS — M6281 Muscle weakness (generalized): Secondary | ICD-10-CM | POA: Diagnosis not present

## 2022-03-30 DIAGNOSIS — G8929 Other chronic pain: Secondary | ICD-10-CM

## 2022-03-30 DIAGNOSIS — M25562 Pain in left knee: Secondary | ICD-10-CM | POA: Diagnosis not present

## 2022-03-30 DIAGNOSIS — M5442 Lumbago with sciatica, left side: Secondary | ICD-10-CM | POA: Diagnosis not present

## 2022-03-30 DIAGNOSIS — M5441 Lumbago with sciatica, right side: Secondary | ICD-10-CM | POA: Diagnosis not present

## 2022-03-30 NOTE — Therapy (Signed)
OUTPATIENT PHYSICAL THERAPY    Patient Name: Terri Farrell MRN: [id number] DOB:1950-12-30, 71 y.o., female Today's Date: 03/30/2022   PT End of Session - 03/30/22 0931     Visit Number 5    Number of Visits 16    Date for PT Re-Evaluation 05/11/22    Authorization - Visit Number 5    Progress Note Due on Visit 10    PT Start Time 0931    PT Stop Time 1010    PT Time Calculation (min) 39 min    Activity Tolerance Patient tolerated treatment well    Behavior During Therapy [facility] for tasks assessed/performed               Past Medical History:  Diagnosis Date   Arthritis    RA   Depression    Past Surgical History:  Procedure Laterality Date   ABDOMINAL HYSTERECTOMY  06/15/1994   for fibroids   CARPAL TUNNEL RELEASE     right and left   CESAREAN SECTION     X 2   MULTIPLE TOOTH EXTRACTIONS  06/2021   had all of her teeth removed. now wears dentures   TOTAL KNEE ARTHROPLASTY  08/14/2010   Patient Active Problem List   Diagnosis Date Noted   Myalgia 03/04/2022   Pain of right heel 03/04/2022   IFG (impaired fasting glucose) 09/01/2021   Abnormal MMSE 09/01/2021   BMI 45.0-49.9, adult ([place]) 01/03/2020   Stage 3a chronic kidney disease ([place]) 05/08/2019   Paresthesia of both lower extremities 05/08/2019   Vitamin D deficiency 01/12/2018   Left knee pain 08/19/2016   Trochanteric bursitis of right hip 08/19/2016   Chronic constipation 12/25/2014   Osteoarthritis of right hip 11/21/2013   GERD (gastroesophageal reflux disease) 11/16/2013   Severe obesity (BMI >= 40) ([place]) 04/14/2013   Essential hypertension, benign 05/13/2011   Obesity 03/09/2011   Insomnia 03/09/2011   Anemia, iron deficiency 10/02/2010   Depression, major, recurrent ([place]) 07/24/2010   KNEE PAIN, RIGHT 07/24/2010   POLYARTHRITIS 07/24/2010    PCP: Hali Marry, MD   REFERRING PROVIDER: Hali Marry, MD   REFERRING DIAG: M79.2 (ICD-10-CM) - Neurogenic pain of lower  extremity, unspecified laterality M54.42,M54.41 (ICD-10-CM) - Acute bilateral low back pain with bilateral sciatica   Rationale for Evaluation and Treatment Rehabilitation  THERAPY DIAG:  Muscle weakness (generalized)  Other abnormalities of gait and mobility  Pain in right hip  Chronic pain of left knee  Stiffness of right hip, not elsewhere classified  Difficulty in walking, not elsewhere classified  ONSET DATE: Ongoing x multiple years  SUBJECTIVE:  SUBJECTIVE STATEMENT: "Not too bad today." Pt states she did a little bit of the exercises.   PERTINENT HISTORY:  R TKA  PAIN:  Are you having pain? Yes: NPRS scale: 5/10 Pain location: bilat hips and knees Pain description: Achy/dull Aggravating factors: Mornings, throughout the day Relieving factors: Tylenol, heat/ice, patches   PRECAUTIONS: None  PATIENT GOALS Less pain and get moving   OBJECTIVE:   LOWER EXTREMITY ROM:     Passive  Right eval Left eval  Hip flexion 100 100  Hip extension 5 10  Hip abduction 20 35  Hip adduction    Hip internal rotation    Hip external rotation    Knee flexion    Knee extension    Ankle dorsiflexion    Ankle plantarflexion    Ankle inversion    Ankle eversion     (Blank rows = not tested)  LOWER EXTREMITY MMT:    MMT Right eval Left eval  Hip flexion 4+ 4+  Hip extension 3- 3-  Hip abduction 3+ 3+  Hip adduction 4 4  Hip internal rotation    Hip external rotation    Knee flexion 4 4  Knee extension 3+ 3+  Ankle dorsiflexion    Ankle plantarflexion    Ankle inversion    Ankle eversion     (Blank rows = not tested)  FUNCTIONAL TESTS:  5 times sit to stand: 16 sec (eval) Timed up and go (TUG): 13.52 sec    5x STS 14.95 (10/9)   TUG 12.98 (10/9)  TODAY'S TREATMENT   03/30/22 Nustep L5 x 5 min for warm up  Seated Hamstring stretch x30 sec  Standing L lateral hip shift 5 secx10 Hands on wall with hip ext toe touch 2x10 Mini wall squat x10  Supine Modified thomas stretch 2x30 sec Butterfly stretch x30 sec Marching red TB 2x10 Clamshell red TB 2x10 SLR 2x10 Bridge 2x10   03/25/22 Nustep L5 x 5 min for warm up  Seated Hamstring stretch 2x30 sec  Supine SKTC 2x30 sec R&L Clamshell red TB 2x10 Marching red TB x10 Straight leg raise 2x10 SAQ red TB 2x10  Standing Hands on wall with hip ext toe touch 2x10 Hands on wall glute set 2x10 L lateral hip shift 2x10 Heel raise 2x10  03/23/22 Nustep L5 x 5 mins for warm up  Standing: Heel raise x 12 Hip abd x 12 bilat Hip ext x 12 bilat HS curl x 12 bilat Tandem stance on foam 2 x 30 sec bilat     Rocker board x 1 min each A/P and laterally, light hand hold  Sit <> stand 3 x 5 Knee ext 2 x 10 bilat red TB Knee flex 2 x 10 bilat red TB Marching x 20    PATIENT EDUCATION:  Education details: Exam findings, POC, HEP Person educated: Patient Education method: Explanation, Demonstration, and Handouts Education comprehension: verbalized understanding, returned demonstration, and needs further education   HOME EXERCISE PROGRAM: Access Code: [email or web address] URL: [email or web address] Date: 03/30/2022 Prepared by: Estill Bamberg April Thurnell Garbe  Exercises - Seated Hamstring Stretch  - 1 x daily - 7 x weekly - 2 sets - 30 sec hold - Seated March  - 1 x daily - 7 x weekly - 2 sets - 10 reps - Seated Knee Extension with Resistance  - 1 x daily - 7 x weekly - 2 sets - 10 reps - Seated Hamstring Curls with Resistance  - 1 x daily - 7 x weekly -  2 sets - 10 reps - Standing Gluteal Sets  - 1 x daily - 7 x weekly - 2 sets - 10 reps - 3 sec hold - Standing Lumbar Extension with Counter  - 1 x daily - 7 x weekly - 1 sets - 10 reps - Lateral Shift Correction at Wall  - 1 x daily - 7 x  weekly - 1 sets - 10 reps  ASSESSMENT:  CLINICAL IMPRESSION: Continued to work on strengthening bilat hips/knees. Working on improving pelvic symmetry.    GOALS: Goals reviewed with patient? Yes  SHORT TERM GOALS: Target date: 04/13/2022  Pt will be ind with initial HEP Baseline: Goal status: INITIAL  2.  Pt will demo improved postural alignment without PT cueing Baseline:  Goal status: INITIAL  3.  Pt will be able to perform 5x STS in </=13 sec to demo improving functional strength Baseline:  Goal status: INITIAL   LONG TERM GOALS: Target date: 05/11/2022  Pt will be ind with maintaining and progressing HEP at home Baseline:  Goal status: INITIAL  2.  Pt will demo at least 4/5 bilat LE strength for improved home function Baseline:  Goal status: INITIAL  3.  Pt will be able to demo improved hip ROM to be able to put on socks/shoes in seated Baseline:  Goal status: INITIAL  4.  Pt will be able to demo equal L and R LE weightshift with </=4/10 pain Baseline:  Goal status: INITIAL  5.  Pt will report decrease in overall pain to </=4/10 with activities Baseline:  Goal status: INITIAL  6.  Pt will have improved FOTO to >/=68 Baseline:  Goal status: INITIAL   PLAN: PT FREQUENCY: 2x/week  PT DURATION: 8 weeks  PLANNED INTERVENTIONS: Therapeutic exercises, Therapeutic activity, Neuromuscular re-education, Balance training, Gait training, Patient/Family education, Self Care, Joint mobilization, Aquatic Therapy, Electrical stimulation, Spinal mobilization, Cryotherapy, Moist heat, Taping, Ionotophoresis 4mg /ml Dexamethasone, Manual therapy, and Re-evaluation.  PLAN FOR NEXT SESSION: Continue to work on postural alignment and bilat LE strengthening, endurance, balance    Riyah Bardon April Ma L Beltrami, PT, DPT 03/30/2022, 9:31 AM

## 2022-04-01 ENCOUNTER — Ambulatory Visit: Payer: Medicare Other | Admitting: Physical Therapy

## 2022-04-01 ENCOUNTER — Ambulatory Visit (INDEPENDENT_AMBULATORY_CARE_PROVIDER_SITE_OTHER): Payer: Medicare Other | Admitting: Family Medicine

## 2022-04-01 ENCOUNTER — Encounter: Payer: Self-pay | Admitting: Family Medicine

## 2022-04-01 ENCOUNTER — Encounter: Payer: Self-pay | Admitting: Physical Therapy

## 2022-04-01 VITALS — BP 132/62 | HR 82 | Ht 64.0 in | Wt 273.0 lb

## 2022-04-01 DIAGNOSIS — R2689 Other abnormalities of gait and mobility: Secondary | ICD-10-CM

## 2022-04-01 DIAGNOSIS — M25551 Pain in right hip: Secondary | ICD-10-CM

## 2022-04-01 DIAGNOSIS — M25651 Stiffness of right hip, not elsewhere classified: Secondary | ICD-10-CM

## 2022-04-01 DIAGNOSIS — M791 Myalgia, unspecified site: Secondary | ICD-10-CM

## 2022-04-01 DIAGNOSIS — M5441 Lumbago with sciatica, right side: Secondary | ICD-10-CM | POA: Diagnosis not present

## 2022-04-01 DIAGNOSIS — M25562 Pain in left knee: Secondary | ICD-10-CM | POA: Diagnosis not present

## 2022-04-01 DIAGNOSIS — R262 Difficulty in walking, not elsewhere classified: Secondary | ICD-10-CM | POA: Diagnosis not present

## 2022-04-01 DIAGNOSIS — M6281 Muscle weakness (generalized): Secondary | ICD-10-CM

## 2022-04-01 DIAGNOSIS — R21 Rash and other nonspecific skin eruption: Secondary | ICD-10-CM | POA: Diagnosis not present

## 2022-04-01 DIAGNOSIS — G8929 Other chronic pain: Secondary | ICD-10-CM | POA: Diagnosis not present

## 2022-04-01 DIAGNOSIS — M5442 Lumbago with sciatica, left side: Secondary | ICD-10-CM

## 2022-04-01 MED ORDER — TRIAMCINOLONE ACETONIDE 0.1 % EX CREA: 1.0000 | TOPICAL_CREAM | Freq: Two times a day (BID) | CUTANEOUS | 0 refills | Status: DC

## 2022-04-01 NOTE — Progress Notes (Signed)
r  Established Patient Office Visit  Subjective   Patient ID: Terri Farrell, female    DOB: [birth year]  Age: 71 y.o. MRN: [id number]  Chief Complaint  Patient presents with   Leg Pain   Back Pain    HPI  Follow-up low back pain with radiculopathy-she has gone to 4 physical therapy sessions thus far.  Has a rash on her left elbow that is been there for quite some time.  It is very itchy that is the arm that she rests on the table and uses her computer.  She also feels that she had a little bit of an itchy rash on the back of her neck.  She is been using an over-the-counter psoriasis cream.  Says she had a lot of problems with her skin when she was a kid.    ROS    Objective:     BP 132/62   Pulse 82   Ht 5\' 4"  (1.626 m)   Wt 273 lb (123.8 kg)   SpO2 96%   BMI 46.86 kg/m    Physical Exam Vitals reviewed.  Constitutional:      Appearance: She is well-developed.  HENT:     Head: Normocephalic and atraumatic.  Eyes:     Conjunctiva/sclera: Conjunctivae normal.  Cardiovascular:     Rate and Rhythm: Normal rate.  Pulmonary:     Effort: Pulmonary effort is normal.  Skin:    General: Skin is dry.     Coloration: Skin is not pale.     Comments: He has a hyperpigmented maculopapular rash on the left elbow going to about the upper third of the forearm.  It is just on the back of the elbow.  She has a little bit of hyperpigmentation on the back of the neck but no papules or macules.  Neurological:     Mental Status: She is alert and oriented to person, place, and time.  Psychiatric:        Behavior: Behavior normal.      No results found for any visits on 04/01/22.    The 10-year ASCVD risk score (Arnett DK, et al., 2019) is: 11.6%    Assessment & Plan:   Problem List Items Addressed This Visit       Nervous and Auditory   Acute bilateral low back pain with bilateral sciatica - Primary    Feeling much better overall. PT has been really helpful.  He has at  least 1 more PT session and they have encouraged her to consider aquatic therapy she is thinking about it but not sure.  Just encouraged her to continue to do her home stretches and exercises to continue to improve and get better.        Other   Myalgia    Did screen positive for fibromyalgia and we discussed that we could consider medications as an option.  She had taken Cymbalta in the past and did not like the side effects.  But also want her to continue to work on exercise and formal physical therapy which can help as well.      Other Visit Diagnoses     Rash       Relevant Medications   triamcinolone cream (KENALOG) 0.1 %       Rash - eczema vs contact dermatitis. Will tx with topical steroid. Call if not better in one week.   Return in about 5 months (around 08/31/2022) for Hypertension.    Beatrice Lecher, MD

## 2022-04-01 NOTE — Assessment & Plan Note (Addendum)
Feeling much better overall. PT has been really helpful.  He has at least 1 more PT session and they have encouraged her to consider aquatic therapy she is thinking about it but not sure.  Just encouraged her to continue to do her home stretches and exercises to continue to improve and get better.

## 2022-04-01 NOTE — Assessment & Plan Note (Signed)
Did screen positive for fibromyalgia and we discussed that we could consider medications as an option.  She had taken Cymbalta in the past and did not like the side effects.  But also want her to continue to work on exercise and formal physical therapy which can help as well.

## 2022-04-01 NOTE — Therapy (Addendum)
OUTPATIENT PHYSICAL THERAPY TREATMENT AND DISCHARGE   Patient Name: Terri Farrell MRN: [id number] DOB:1951/03/02, 71 y.o., female Today's Date: 04/01/2022   PT End of Session - 04/01/22 1019     Visit Number 6    Number of Visits 16    Date for PT Re-Evaluation 05/11/22    Authorization Type [employer]    Authorization - Visit Number 6    Progress Note Due on Visit 10    PT Start Time 1019    PT Stop Time 1100    PT Time Calculation (min) 41 min    Activity Tolerance Patient tolerated treatment well    Behavior During Therapy WFL for tasks assessed/performed                Past Medical History:  Diagnosis Date   Arthritis    RA   Depression    Past Surgical History:  Procedure Laterality Date   ABDOMINAL HYSTERECTOMY  06/15/1994   for fibroids   CARPAL TUNNEL RELEASE     right and left   CESAREAN SECTION     X 2   MULTIPLE TOOTH EXTRACTIONS  06/2021   had all of her teeth removed. now wears dentures   TOTAL KNEE ARTHROPLASTY  08/14/2010   Patient Active Problem List   Diagnosis Date Noted   Myalgia 03/04/2022   Pain of right heel 03/04/2022   IFG (impaired fasting glucose) 09/01/2021   Abnormal MMSE 09/01/2021   BMI 45.0-49.9, adult ([place]) 01/03/2020   Stage 3a chronic kidney disease ([place]) 05/08/2019   Paresthesia of both lower extremities 05/08/2019   Vitamin D deficiency 01/12/2018   Left knee pain 08/19/2016   Trochanteric bursitis of right hip 08/19/2016   Chronic constipation 12/25/2014   Osteoarthritis of right hip 11/21/2013   GERD (gastroesophageal reflux disease) 11/16/2013   Severe obesity (BMI >= 40) ([place]) 04/14/2013   Essential hypertension, benign 05/13/2011   Obesity 03/09/2011   Insomnia 03/09/2011   Anemia, iron deficiency 10/02/2010   Depression, major, recurrent ([place]) 07/24/2010   KNEE PAIN, RIGHT 07/24/2010   POLYARTHRITIS 07/24/2010    PCP: Hali Marry, MD   REFERRING PROVIDER: Hali Marry, MD   REFERRING  DIAG: M79.2 (ICD-10-CM) - Neurogenic pain of lower extremity, unspecified laterality M54.42,M54.41 (ICD-10-CM) - Acute bilateral low back pain with bilateral sciatica   Rationale for Evaluation and Treatment Rehabilitation  THERAPY DIAG:  Muscle weakness (generalized)  Other abnormalities of gait and mobility  Pain in right hip  Chronic pain of left knee  Stiffness of right hip, not elsewhere classified  Difficulty in walking, not elsewhere classified  ONSET DATE: Ongoing x multiple years  SUBJECTIVE:  SUBJECTIVE STATEMENT: Pt reports that she saw the doctor and had a good visit. Pt states that her R hip locked up on the walk to therapy from her doctor's office.   PERTINENT HISTORY:  R TKA  PAIN:  Are you having pain? Yes: NPRS scale: 5/10 Pain location: bilat hips and knees Pain description: Achy/dull Aggravating factors: Mornings, throughout the day Relieving factors: Tylenol, heat/ice, patches   PRECAUTIONS: None  PATIENT GOALS Less pain and get moving   OBJECTIVE:   LOWER EXTREMITY ROM:     Passive  Right eval Left eval  Hip flexion 100 100  Hip extension 5 10  Hip abduction 20 35  Hip adduction    Hip internal rotation    Hip external rotation    Knee flexion    Knee extension    Ankle dorsiflexion    Ankle plantarflexion    Ankle inversion    Ankle eversion     (Blank rows = not tested)  LOWER EXTREMITY MMT:    MMT Right eval Left eval  Hip flexion 4+ 4+  Hip extension 3- 3-  Hip abduction 3+ 3+  Hip adduction 4 4  Hip internal rotation    Hip external rotation    Knee flexion 4 4  Knee extension 3+ 3+  Ankle dorsiflexion    Ankle plantarflexion    Ankle inversion    Ankle eversion     (Blank rows = not tested)  FUNCTIONAL TESTS:  5 times sit to  stand: 16 sec (eval) Timed up and go (TUG): 13.52 sec    5x STS 14.95 (10/9)   TUG 12.98 (10/9)  TODAY'S TREATMENT  04/01/22 Nustep L5x5 min for warm up UEs/LEs  Standing  L lateral hip shift 5 secx10 R lateral side bend stretch x30 sec Glute set with lateral hip shift for improved alignment  Supine Bridge 2x10 Hip abd iso 10x5 sec R&L Hip add iso 10x5 sec R&L Trunk rotation x10 Hamstring stretch x30 sec  MANUAL THERAPY LAD R hip R hip PROM all directions Grade II to III hip lateral and medial hip mobilizations   03/30/22 Nustep L5 x 5 min for warm up  Seated Hamstring stretch x30 sec  Standing L lateral hip shift 5 secx10 Hands on wall with hip ext toe touch 2x10 Mini wall squat x10  Supine Modified thomas stretch 2x30 sec Butterfly stretch x30 sec Marching red TB 2x10 Clamshell red TB 2x10 SLR 2x10 Bridge 2x10    PATIENT EDUCATION:  Education details: Exam findings, POC, HEP Person educated: Patient Education method: Explanation, Demonstration, and Handouts Education comprehension: verbalized understanding, returned demonstration, and needs further education   HOME EXERCISE PROGRAM: Access Code: [email or web address] URL: [email or web address] Date: 03/30/2022 Prepared by: Estill Bamberg April Thurnell Garbe  Exercises - Seated Hamstring Stretch  - 1 x daily - 7 x weekly - 2 sets - 30 sec hold - Seated March  - 1 x daily - 7 x weekly - 2 sets - 10 reps - Seated Knee Extension with Resistance  - 1 x daily - 7 x weekly - 2 sets - 10 reps - Seated Hamstring Curls with Resistance  - 1 x daily - 7 x weekly - 2 sets - 10 reps - Standing Gluteal Sets  - 1 x daily - 7 x weekly - 2 sets - 10 reps - 3 sec hold - Standing Lumbar Extension with Counter  - 1 x daily - 7 x weekly - 1 sets - 10 reps - Lateral  Shift Correction at Wall  - 1 x daily - 7 x weekly - 1 sets - 10 reps  ASSESSMENT:  CLINICAL IMPRESSION: Session primarily worked on improving R hip joint  mobility. Working on improving alignment. Pt's hip very tight with IR/ER and abd/add. Improving ant/post movements.    GOALS: Goals reviewed with patient? Yes  SHORT TERM GOALS: Target date: 04/13/2022  Pt will be ind with initial HEP Baseline: Goal status: INITIAL  2.  Pt will demo improved postural alignment without PT cueing Baseline:  Goal status: INITIAL  3.  Pt will be able to perform 5x STS in </=13 sec to demo improving functional strength Baseline:  Goal status: INITIAL   LONG TERM GOALS: Target date: 05/11/2022  Pt will be ind with maintaining and progressing HEP at home Baseline:  Goal status: INITIAL  2.  Pt will demo at least 4/5 bilat LE strength for improved home function Baseline:  Goal status: INITIAL  3.  Pt will be able to demo improved hip ROM to be able to put on socks/shoes in seated Baseline:  Goal status: INITIAL  4.  Pt will be able to demo equal L and R LE weightshift with </=4/10 pain Baseline:  Goal status: INITIAL  5.  Pt will report decrease in overall pain to </=4/10 with activities Baseline:  Goal status: INITIAL  6.  Pt will have improved FOTO to >/=68 Baseline:  Goal status: INITIAL   PLAN: PT FREQUENCY: 2x/week  PT DURATION: 8 weeks  PLANNED INTERVENTIONS: Therapeutic exercises, Therapeutic activity, Neuromuscular re-education, Balance training, Gait training, Patient/Family education, Self Care, Joint mobilization, Aquatic Therapy, Electrical stimulation, Spinal mobilization, Cryotherapy, Moist heat, Taping, Ionotophoresis 69m/ml Dexamethasone, Manual therapy, and Re-evaluation.  PLAN FOR NEXT SESSION: Continue to work on postural alignment and bilat LE strengthening, endurance, balance   PHYSICAL THERAPY DISCHARGE SUMMARY  Visits from Start of Care: 6  Current functional level related to goals / functional outcomes: Improving hip mobility   Remaining deficits: See above   Education / Equipment: HEP   Patient  agrees to discharge. Patient goals were not met. Patient is being discharged due to not returning since the last visit.  KIsabelle Course PT,DPT12/05/2309:01 AM   GEstill BambergApril MGordy Levan PT, DPT 04/01/2022, 10:19 AM

## 2022-04-01 NOTE — Progress Notes (Signed)
Pt reports that she has been doing PT and this has helped her.

## 2022-04-14 ENCOUNTER — Encounter: Payer: Medicare Other | Admitting: Physical Therapy

## 2022-04-16 ENCOUNTER — Encounter: Payer: Medicare Other | Admitting: Physical Therapy

## 2022-04-22 ENCOUNTER — Encounter: Payer: Medicare Other | Admitting: Physical Therapy

## 2022-04-24 ENCOUNTER — Ambulatory Visit: Payer: Medicare Other | Attending: Family Medicine | Admitting: Physical Therapy

## 2022-04-24 DIAGNOSIS — M6281 Muscle weakness (generalized): Secondary | ICD-10-CM | POA: Insufficient documentation

## 2022-04-24 DIAGNOSIS — M25562 Pain in left knee: Secondary | ICD-10-CM | POA: Insufficient documentation

## 2022-04-24 DIAGNOSIS — M5441 Lumbago with sciatica, right side: Secondary | ICD-10-CM | POA: Insufficient documentation

## 2022-04-24 DIAGNOSIS — R262 Difficulty in walking, not elsewhere classified: Secondary | ICD-10-CM | POA: Insufficient documentation

## 2022-04-24 DIAGNOSIS — G8929 Other chronic pain: Secondary | ICD-10-CM | POA: Insufficient documentation

## 2022-04-24 DIAGNOSIS — R2689 Other abnormalities of gait and mobility: Secondary | ICD-10-CM | POA: Insufficient documentation

## 2022-04-24 DIAGNOSIS — M5442 Lumbago with sciatica, left side: Secondary | ICD-10-CM | POA: Insufficient documentation

## 2022-04-24 DIAGNOSIS — M25551 Pain in right hip: Secondary | ICD-10-CM | POA: Insufficient documentation

## 2022-04-24 DIAGNOSIS — M25651 Stiffness of right hip, not elsewhere classified: Secondary | ICD-10-CM | POA: Insufficient documentation

## 2022-05-01 ENCOUNTER — Other Ambulatory Visit: Payer: Self-pay

## 2022-05-01 MED ORDER — HYDROCHLOROTHIAZIDE 12.5 MG PO CAPS: 12.5000 mg | ORAL_CAPSULE | Freq: Every day | ORAL | 3 refills | Status: DC

## 2022-05-07 ENCOUNTER — Other Ambulatory Visit: Payer: Self-pay | Admitting: Family Medicine

## 2022-08-21 ENCOUNTER — Other Ambulatory Visit: Payer: Self-pay | Admitting: Family Medicine

## 2022-08-21 DIAGNOSIS — Z9289 Personal history of other medical treatment: Secondary | ICD-10-CM

## 2022-09-01 ENCOUNTER — Encounter: Payer: Self-pay | Admitting: Family Medicine

## 2022-09-01 ENCOUNTER — Telehealth: Payer: Self-pay | Admitting: Family Medicine

## 2022-09-01 ENCOUNTER — Ambulatory Visit (INDEPENDENT_AMBULATORY_CARE_PROVIDER_SITE_OTHER): Payer: 59 | Admitting: Family Medicine

## 2022-09-01 VITALS — BP 132/58 | HR 70 | Ht 64.0 in | Wt 294.0 lb

## 2022-09-01 DIAGNOSIS — I10 Essential (primary) hypertension: Secondary | ICD-10-CM | POA: Diagnosis not present

## 2022-09-01 DIAGNOSIS — R7301 Impaired fasting glucose: Secondary | ICD-10-CM

## 2022-09-01 DIAGNOSIS — N1831 Chronic kidney disease, stage 3a: Secondary | ICD-10-CM | POA: Diagnosis not present

## 2022-09-01 MED ORDER — HYDROCHLOROTHIAZIDE 12.5 MG PO CAPS: 12.5000 mg | ORAL_CAPSULE | Freq: Every day | ORAL | 3 refills | Status: DC

## 2022-09-01 NOTE — Assessment & Plan Note (Signed)
No recent changes.  Will get updated labs.

## 2022-09-01 NOTE — Telephone Encounter (Signed)
Patient reports had her last shingles vaccine done at [facility].  Please abstract or call for copy of immunization.

## 2022-09-01 NOTE — Assessment & Plan Note (Signed)
Well controlled. Continue current regimen. Follow up in  6 mo  

## 2022-09-01 NOTE — Progress Notes (Signed)
   Established Patient Office Visit  Subjective   Patient ID: Terri Farrell, female    DOB: Jan 29, 1951  Age: 71 y.o. MRN: [id number]  Chief Complaint  Patient presents with   Hypertension    HPI   Hypertension- Pt denies chest pain, SOB, dizziness, or heart palpitations.  Taking meds as directed w/o problems.  Denies medication side effects.  Significant problems actually getting her medication refilled through the mail order.  They told her that we had denied her prescription but we had sent a years worth back in November so she should have had enough until November 2024.  She would like to switch back to [facility].she went about 10 days without her medication.   Impaired fasting glucose-no increased thirst or urination. No symptoms consistent with hypoglycemia.  She does have her mammogram scheduled for April.  She says she did get her first shingles vaccine back in the fall at [facility].    ROS    Objective:     BP (!) 132/58   Pulse 70   Ht 5\' 4"  (1.626 m)   Wt 294 lb (133.4 kg)   SpO2 96%   BMI 50.46 kg/m    Physical Exam Vitals and nursing note reviewed.  Constitutional:      Appearance: She is well-developed.  HENT:     Head: Normocephalic and atraumatic.  Cardiovascular:     Rate and Rhythm: Normal rate and regular rhythm.     Heart sounds: Normal heart sounds.  Pulmonary:     Effort: Pulmonary effort is normal.     Breath sounds: Normal breath sounds.  Skin:    General: Skin is warm and dry.  Neurological:     Mental Status: She is alert and oriented to person, place, and time.  Psychiatric:        Behavior: Behavior normal.      No results found for any visits on 09/01/22.    The 10-year ASCVD risk score (Arnett DK, et al., 2019) is: 12.3%    Assessment & Plan:   Problem List Items Addressed This Visit       Cardiovascular and Mediastinum   Essential hypertension, benign - Primary    Well controlled. Continue current regimen. Follow up in  13mo         Relevant Medications   hydrochlorothiazide (MICROZIDE) 12.5 MG capsule   Other Relevant Orders   COMPLETE METABOLIC PANEL WITH GFR   Parathyroid hormone, intact (no Ca)   Urine Microalbumin w/creat. ratio   Phosphorus     Endocrine   IFG (impaired fasting glucose)    Due to recheck A1c today.  She says she is got off track a little bit with her diet celebrating her birthday recently but plans to get back on track.      Relevant Orders   COMPLETE METABOLIC PANEL WITH GFR   Parathyroid hormone, intact (no Ca)   Urine Microalbumin w/creat. ratio   Phosphorus   Hemoglobin A1c     Genitourinary   Stage 3a chronic kidney disease (HCC)    No recent changes.  Will get updated labs.      Relevant Orders   COMPLETE METABOLIC PANEL WITH GFR   Parathyroid hormone, intact (no Ca)   Urine Microalbumin w/creat. ratio   Phosphorus    Return in about 6 months (around 03/04/2023) for Hypertension, Pre-diabetes.    Beatrice Lecher, MD

## 2022-09-01 NOTE — Assessment & Plan Note (Signed)
Due to recheck A1c today.  She says she is got off track a little bit with her diet celebrating her birthday recently but plans to get back on track.

## 2022-09-02 ENCOUNTER — Other Ambulatory Visit: Payer: Self-pay

## 2022-09-02 DIAGNOSIS — R945 Abnormal results of liver function studies: Secondary | ICD-10-CM

## 2022-09-02 LAB — COMPLETE METABOLIC PANEL WITH GFR
AG Ratio: 1.3 (calc) (ref 1.0–2.5)
ALT: 31 U/L — ABNORMAL HIGH (ref 6–29)
AST: 34 U/L (ref 10–35)
Albumin: 4.1 g/dL (ref 3.6–5.1)
Alkaline phosphatase (APISO): 62 U/L (ref 37–153)
BUN: 20 mg/dL (ref 7–25)
CO2: 26 mmol/L (ref 20–32)
Calcium: 9.8 mg/dL (ref 8.6–10.4)
Chloride: 102 mmol/L (ref 98–110)
Creat: 0.92 mg/dL (ref 0.60–1.00)
Globulin: 3.1 g/dL (calc) (ref 1.9–3.7)
Glucose, Bld: 93 mg/dL (ref 65–99)
Potassium: 4.3 mmol/L (ref 3.5–5.3)
Sodium: 143 mmol/L (ref 135–146)
Total Bilirubin: 0.5 mg/dL (ref 0.2–1.2)
Total Protein: 7.2 g/dL (ref 6.1–8.1)
eGFR: 67 mL/min/{1.73_m2} (ref >=60)

## 2022-09-02 LAB — MICROALBUMIN / CREATININE URINE RATIO
Creatinine, Urine: 50 mg/dL (ref 20–275)
Microalb Creat Ratio: 4 mg/g creat (ref <30)
Microalb, Ur: 0.2 mg/dL

## 2022-09-02 LAB — HEMOGLOBIN A1C
Hgb A1c MFr Bld: 5.7 % of total Hgb — ABNORMAL HIGH (ref <5.7)
Mean Plasma Glucose: 117 mg/dL
eAG (mmol/L): 6.5 mmol/L

## 2022-09-02 LAB — PHOSPHORUS: Phosphorus: 4 mg/dL (ref 2.1–4.3)

## 2022-09-02 LAB — PARATHYROID HORMONE, INTACT (NO CA): PTH: 77 pg/mL (ref 16–77)

## 2022-09-02 NOTE — Progress Notes (Signed)
Call patient: Kidney function is stable.  One of the liver enzymes is just borderline.  So want a recheck that in about 3 to 4 weeks a CMP.  A1c has gone up from last time.  So blood sugars are running just a little bit higher.  Continue to work on [employer] and staying active.  No excess protein in the urine which is good.  Normal phosphorus level.  And normal parathyroid hormone level.

## 2022-09-17 DIAGNOSIS — H40023 Open angle with borderline findings, high risk, bilateral: Secondary | ICD-10-CM | POA: Diagnosis not present

## 2022-09-28 ENCOUNTER — Telehealth: Payer: Self-pay | Admitting: Family Medicine

## 2022-09-28 NOTE — Telephone Encounter (Signed)
Please call patient and let her know that I did receive the note from the nurse that came out to her house from the insurance company.  They did do some blood flow tests and there was a slight abnormality on the left side.  It was just borderline.  If she is having any pain or discoloration of the toes or foot on that side then I do recommend that we work this up further with specialized blood pressures of the leg and the foot but if not then we can continue to monitor.

## 2022-09-29 NOTE — Telephone Encounter (Signed)
Terri Farrell reports no problems with her foot or toes.

## 2022-09-29 NOTE — Telephone Encounter (Signed)
K, since it was borderline we will just hold off and will monitor for any symptoms.

## 2022-09-30 ENCOUNTER — Ambulatory Visit (INDEPENDENT_AMBULATORY_CARE_PROVIDER_SITE_OTHER): Payer: 59

## 2022-09-30 DIAGNOSIS — Z9289 Personal history of other medical treatment: Secondary | ICD-10-CM

## 2022-09-30 DIAGNOSIS — R945 Abnormal results of liver function studies: Secondary | ICD-10-CM | POA: Diagnosis not present

## 2022-09-30 DIAGNOSIS — Z1231 Encounter for screening mammogram for malignant neoplasm of breast: Secondary | ICD-10-CM

## 2022-10-01 LAB — COMPLETE METABOLIC PANEL WITH GFR
AG Ratio: 1.4 (calc) (ref 1.0–2.5)
ALT: 17 U/L (ref 6–29)
AST: 20 U/L (ref 10–35)
Albumin: 4.4 g/dL (ref 3.6–5.1)
Alkaline phosphatase (APISO): 45 U/L (ref 37–153)
BUN: 14 mg/dL (ref 7–25)
CO2: 23 mmol/L (ref 20–32)
Calcium: 10.4 mg/dL (ref 8.6–10.4)
Chloride: 102 mmol/L (ref 98–110)
Creat: 0.97 mg/dL (ref 0.60–1.00)
Globulin: 3.1 g/dL (calc) (ref 1.9–3.7)
Glucose, Bld: 98 mg/dL (ref 65–99)
Potassium: 4.3 mmol/L (ref 3.5–5.3)
Sodium: 140 mmol/L (ref 135–146)
Total Bilirubin: 0.7 mg/dL (ref 0.2–1.2)
Total Protein: 7.5 g/dL (ref 6.1–8.1)
eGFR: 63 mL/min/{1.73_m2} (ref >=60)

## 2022-10-01 NOTE — Progress Notes (Signed)
Your lab work is within acceptable range and there are no concerning findings.   ?

## 2022-10-01 NOTE — Progress Notes (Signed)
Please call patient. Normal mammogram.  Repeat in 1 year.  

## 2023-01-11 DIAGNOSIS — H401121 Primary open-angle glaucoma, left eye, mild stage: Secondary | ICD-10-CM | POA: Diagnosis not present

## 2023-03-04 ENCOUNTER — Ambulatory Visit (INDEPENDENT_AMBULATORY_CARE_PROVIDER_SITE_OTHER): Payer: 59 | Admitting: Family Medicine

## 2023-03-04 ENCOUNTER — Encounter: Payer: Self-pay | Admitting: Family Medicine

## 2023-03-04 VITALS — BP 130/56 | HR 90 | Ht 64.0 in | Wt 293.0 lb

## 2023-03-04 DIAGNOSIS — R7301 Impaired fasting glucose: Secondary | ICD-10-CM

## 2023-03-04 DIAGNOSIS — F330 Major depressive disorder, recurrent, mild: Secondary | ICD-10-CM

## 2023-03-04 DIAGNOSIS — M5441 Lumbago with sciatica, right side: Secondary | ICD-10-CM

## 2023-03-04 DIAGNOSIS — N1831 Chronic kidney disease, stage 3a: Secondary | ICD-10-CM

## 2023-03-04 DIAGNOSIS — M5442 Lumbago with sciatica, left side: Secondary | ICD-10-CM

## 2023-03-04 DIAGNOSIS — I10 Essential (primary) hypertension: Secondary | ICD-10-CM | POA: Diagnosis not present

## 2023-03-04 LAB — POCT GLYCOSYLATED HEMOGLOBIN (HGB A1C): Hemoglobin A1C: 5.1 % (ref 4.0–5.6)

## 2023-03-04 MED ORDER — DULOXETINE HCL 30 MG PO CPEP
30.0000 mg | ORAL_CAPSULE | Freq: Every day | ORAL | 0 refills | Status: DC
Start: 2023-03-04 — End: 2023-03-04

## 2023-03-04 MED ORDER — DULOXETINE HCL 30 MG PO CPEP
30.0000 mg | ORAL_CAPSULE | Freq: Every day | ORAL | 0 refills | Status: DC
Start: 2023-03-04 — End: 2023-10-06

## 2023-03-04 NOTE — Progress Notes (Signed)
Established Patient Office Visit  Subjective   Patient ID: Terri Farrell, female    DOB: 05-22-1951  Age: 72 y.o. MRN: [id number]  Chief Complaint  Patient presents with   Hypertension   ifg    HPI   Hypertension- Pt denies chest pain, SOB, dizziness, or heart palpitations.  Taking meds as directed w/o problems.  Denies medication side effects.    Impaired fasting glucose-no increased thirst or urination. No symptoms consistent with hypoglycemia.  He says just been really hard to be active her pain has really ramped back up recently and it is just made it difficult she says sometimes she even gets short of breath with movement and walking but she really feels like it is in part because of her back pain.  She does feel like the pain is a little better when she really gets going it is always worse in the mornings.  She says she is planning on starting an exercise class on Tuesdays and Thursdays that her brother who is 58 takes.  Has been encouraging her to come.  Mostly relies on Tylenol for pain control.  She says she has had a lot of pressure in her eyes.  She tries to stay as active as she can she tries to go to church when she can but sometimes she just does not feel good enough to go  She also has been struggling more with her irritable bowel lately.  She says she is sometimes constipated then she will have diarrhea it just does not seem as settled recently.     ROS    Objective:     BP (!) 130/56   Pulse 90   Ht 5\' 4"  (1.626 m)   Wt 293 lb (132.9 kg)   SpO2 99%   BMI 50.29 kg/m    Physical Exam Vitals and nursing note reviewed.  Constitutional:      Appearance: Normal appearance.  HENT:     Head: Normocephalic and atraumatic.  Eyes:     Conjunctiva/sclera: Conjunctivae normal.  Cardiovascular:     Rate and Rhythm: Normal rate and regular rhythm.  Pulmonary:     Effort: Pulmonary effort is normal.     Breath sounds: Normal breath sounds.  Skin:     General: Skin is warm and dry.  Neurological:     Mental Status: She is alert.  Psychiatric:        Mood and Affect: Mood normal.      Results for orders placed or performed in visit on 03/04/23  POCT HgB A1C  Result Value Ref Range   Hemoglobin A1C 5.1 4.0 - 5.6 %   HbA1c POC (<> result, manual entry)     HbA1c, POC (prediabetic range)     HbA1c, POC (controlled diabetic range)        The 10-year ASCVD risk score (Arnett DK, et al., 2019) is: 12%    Assessment & Plan:   Problem List Items Addressed This Visit       Cardiovascular and Mediastinum   Essential hypertension, benign - Primary    Well controlled. Continue current regimen. Follow up in  6months.         Endocrine   IFG (impaired fasting glucose)    When seen much improved today at 5.1 she has really done a great job.  Her weight is down 1 pound from last time but she is trying to eat really healthy she says she really does not have much  of an appetite she really just cannot move and do a lot because of her back.      Relevant Orders   POCT HgB A1C (Completed)     Nervous and Auditory   Acute bilateral low back pain with bilateral sciatica    Back has really been flaring she said it really got better after physical therapy and she was doing well until about 2 months ago where it is really flared back up is really painful especially first thing in the morning she says when she gets going it loosens up and it feels a little better.  She is mostly relying on Tylenol.  We did discuss doing a trial of Cymbalta to see if that is helpful we can always adjust dose as needed.      Relevant Medications   DULoxetine (CYMBALTA) 30 MG capsule     Genitourinary   Stage 3a chronic kidney disease (HCC)    Check renal function at next visit.  Urine micral may have been negative back in the spring.        Other   Depression, major, recurrent (HCC)         09/01/2022    9:46 AM 04/01/2022    9:32 AM 03/04/2022     1:37 PM  PHQ9 SCORE ONLY  PHQ-9 Total Score 10 4 13          Relevant Medications   DULoxetine (CYMBALTA) 30 MG capsule   Return in about 2 months (around 05/04/2023) for New start medication.    Nani Gasser, MD

## 2023-03-04 NOTE — Assessment & Plan Note (Signed)
Back has really been flaring she said it really got better after physical therapy and she was doing well until about 2 months ago where it is really flared back up is really painful especially first thing in the morning she says when she gets going it loosens up and it feels a little better.  She is mostly relying on Tylenol.  We did discuss doing a trial of Cymbalta to see if that is helpful we can always adjust dose as needed.

## 2023-03-04 NOTE — Assessment & Plan Note (Signed)
When seen much improved today at 5.1 she has really done a great job.  Her weight is down 1 pound from last time but she is trying to eat really healthy she says she really does not have much of an appetite she really just cannot move and do a lot because of her back.

## 2023-03-04 NOTE — Assessment & Plan Note (Signed)
      09/01/2022    9:46 AM 04/01/2022    9:32 AM 03/04/2022    1:37 PM  PHQ9 SCORE ONLY  PHQ-9 Total Score 10 4 13

## 2023-03-04 NOTE — Assessment & Plan Note (Signed)
Well controlled. Continue current regimen. Follow up in  6 months.

## 2023-03-04 NOTE — Assessment & Plan Note (Signed)
Check renal function at next visit.  Urine micral may have been negative back in the spring.

## 2023-03-05 ENCOUNTER — Telehealth: Payer: Self-pay | Admitting: Family Medicine

## 2023-03-05 NOTE — Telephone Encounter (Signed)
She got a big bill when seen last time. Can you check on this. She is Medicare so really should only have to pay her copay which is $0.

## 2023-03-12 NOTE — Telephone Encounter (Signed)
Called pt lvm asking that she rtn call

## 2023-03-12 NOTE — Telephone Encounter (Signed)
Hi Tonya, would you mind following up with her to make sure that someone contacted her from billing.  Thank you so much.

## 2023-04-05 DIAGNOSIS — H401121 Primary open-angle glaucoma, left eye, mild stage: Secondary | ICD-10-CM | POA: Diagnosis not present

## 2023-05-04 ENCOUNTER — Ambulatory Visit: Payer: 59 | Admitting: Family Medicine

## 2023-07-19 ENCOUNTER — Other Ambulatory Visit: Payer: Self-pay | Admitting: Family Medicine

## 2023-07-19 DIAGNOSIS — Z1231 Encounter for screening mammogram for malignant neoplasm of breast: Secondary | ICD-10-CM

## 2023-07-21 DIAGNOSIS — H401121 Primary open-angle glaucoma, left eye, mild stage: Secondary | ICD-10-CM | POA: Diagnosis not present

## 2023-08-24 ENCOUNTER — Ambulatory Visit: Payer: 59

## 2023-08-24 ENCOUNTER — Encounter: Payer: 59 | Admitting: Family Medicine

## 2023-09-15 IMAGING — MG MM DIGITAL SCREENING BILAT W/ TOMO AND CAD
6 of 10 series · 6 of 30 positions shown · non-contrast
Comparison: Previous exam(s).

CLINICAL DATA: Screening.

EXAM:
DIGITAL SCREENING BILATERAL MAMMOGRAM WITH TOMOSYNTHESIS AND CAD
TECHNIQUE: Bilateral screening digital craniocaudal and mediolateral oblique
mammograms were obtained. Bilateral screening digital breast
tomosynthesis was performed. The images were evaluated with
computer-aided detection.

[R MLO synth-2D]
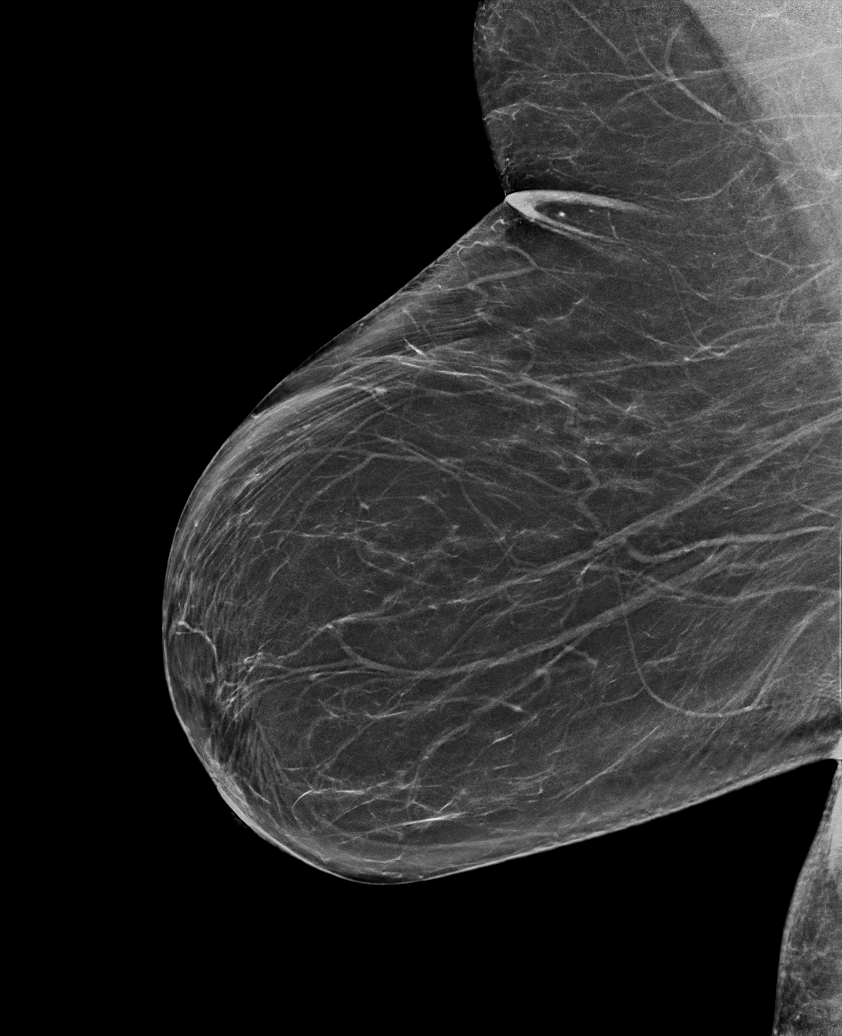

[R CC synth-2D]
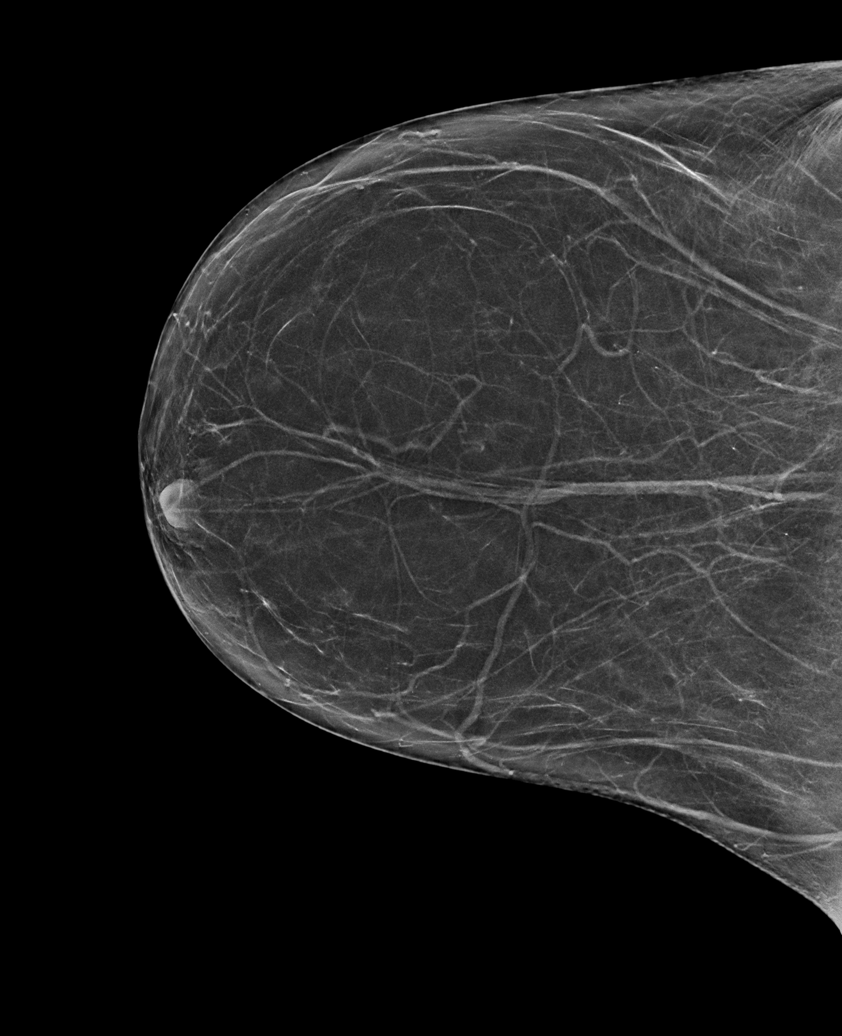

[L MLO synth-2D (1 of 2)]
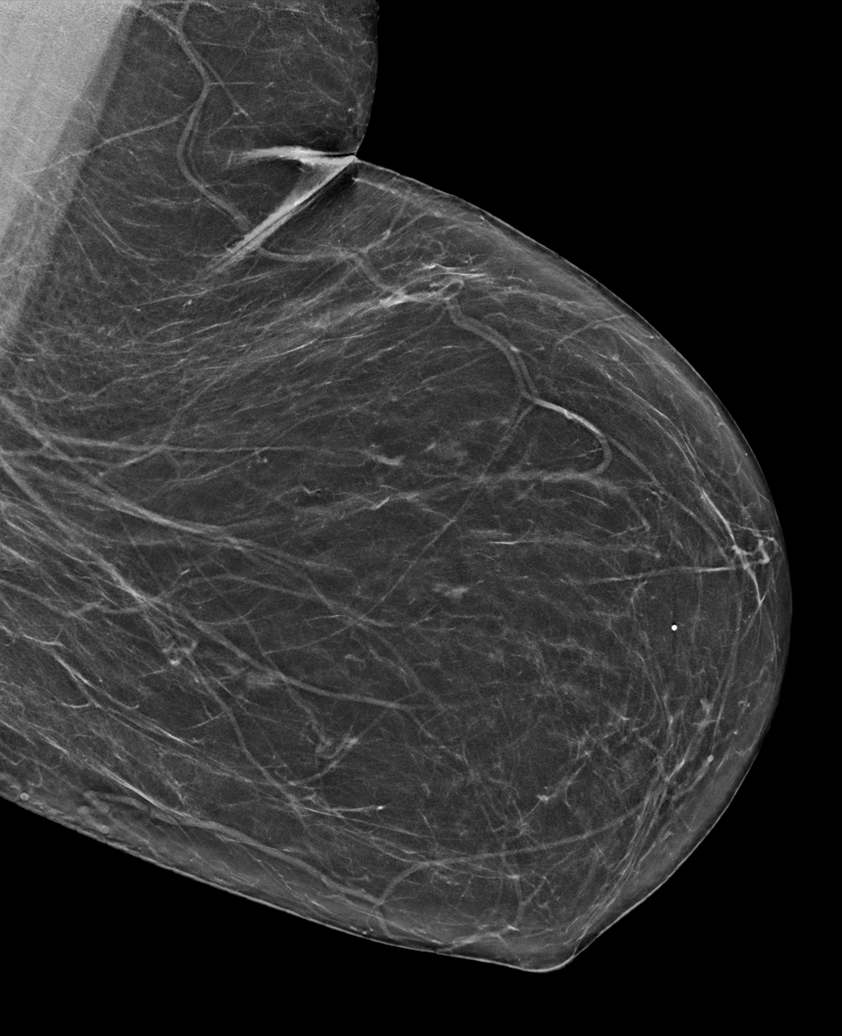

[L CC synth-2D]
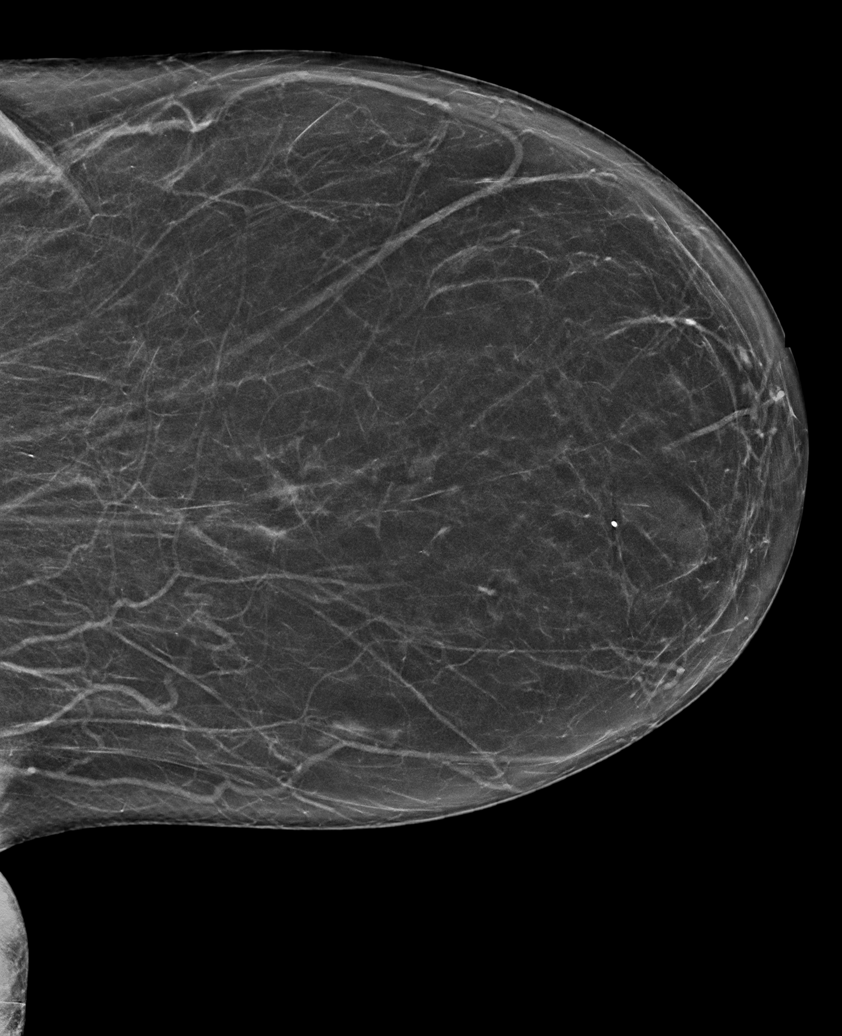

[L MLO synth-2D (2 of 2)]
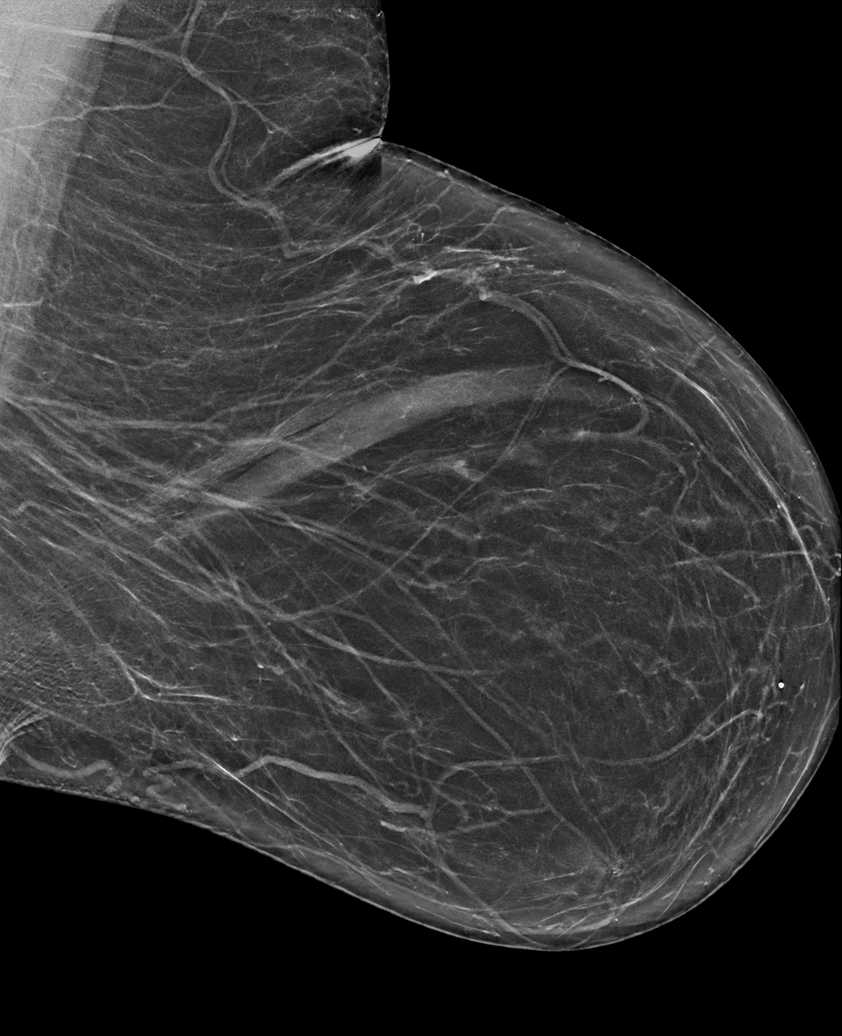

[L MLO tomo · tomo slice 35/69.0]
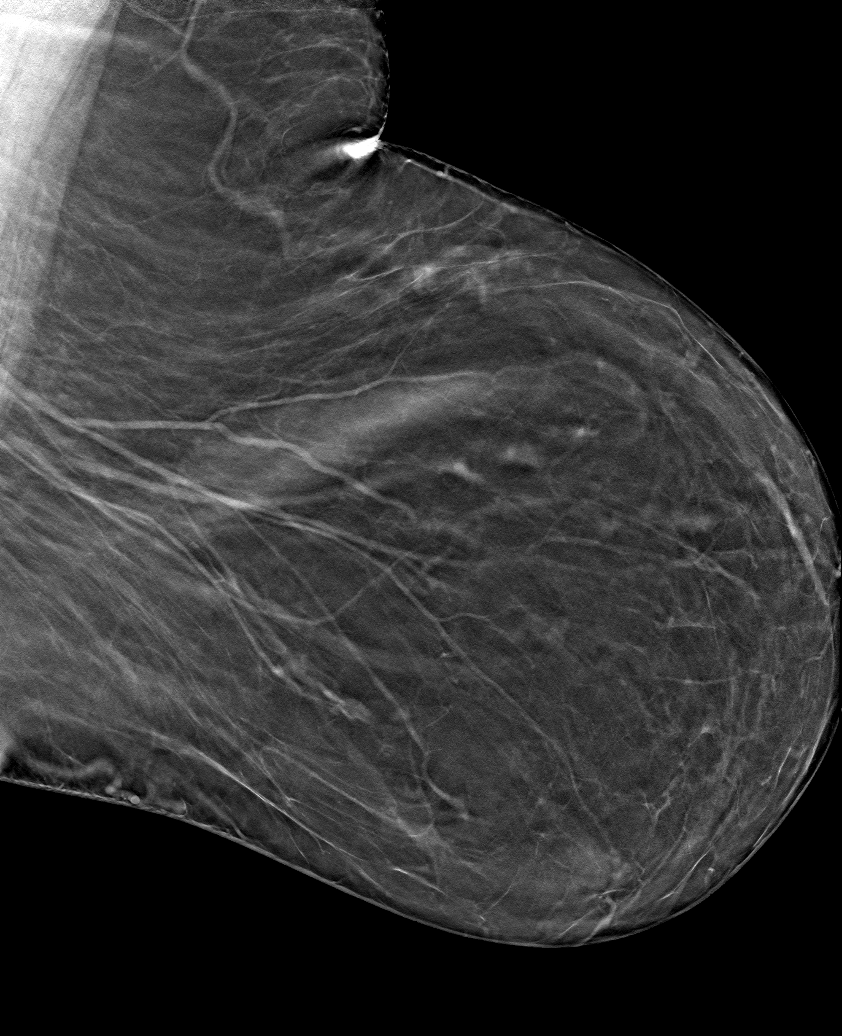

[6 of 30 positions shown; findings below may reference images not displayed]

ACR Breast Density Category c: The breast tissue is heterogeneously
dense, which may obscure small masses.
FINDINGS: There are no findings suspicious for malignancy.
IMPRESSION: No mammographic evidence of malignancy. A result letter of this
screening mammogram will be mailed directly to the patient.

RECOMMENDATION:
Screening mammogram in one year. (Code:Q3-W-BC3)

BI-RADS CATEGORY  1: Negative.

## 2023-10-06 ENCOUNTER — Ambulatory Visit: Payer: 59

## 2023-10-06 ENCOUNTER — Encounter: Payer: Self-pay | Admitting: Family Medicine

## 2023-10-06 ENCOUNTER — Ambulatory Visit (INDEPENDENT_AMBULATORY_CARE_PROVIDER_SITE_OTHER): Admitting: Family Medicine

## 2023-10-06 VITALS — BP 127/86 | HR 40 | Ht 64.0 in | Wt 299.0 lb

## 2023-10-06 DIAGNOSIS — R7301 Impaired fasting glucose: Secondary | ICD-10-CM | POA: Diagnosis not present

## 2023-10-06 DIAGNOSIS — N1831 Chronic kidney disease, stage 3a: Secondary | ICD-10-CM | POA: Diagnosis not present

## 2023-10-06 DIAGNOSIS — Z1231 Encounter for screening mammogram for malignant neoplasm of breast: Secondary | ICD-10-CM | POA: Diagnosis not present

## 2023-10-06 DIAGNOSIS — I10 Essential (primary) hypertension: Secondary | ICD-10-CM

## 2023-10-06 DIAGNOSIS — Z0000 Encounter for general adult medical examination without abnormal findings: Secondary | ICD-10-CM | POA: Diagnosis not present

## 2023-10-06 DIAGNOSIS — F330 Major depressive disorder, recurrent, mild: Secondary | ICD-10-CM

## 2023-10-06 NOTE — Progress Notes (Addendum)
 Complete physical exam  Patient: Terri Farrell   DOB: 1951/04/27   73 y.o. Female  MRN: [id number]  Subjective:    Chief Complaint  Patient presents with  . Annual Exam    Terri Farrell is a 73 y.o. female who presents today for a complete physical exam. She reports consuming a general diet. The patient does not participate in regular exercise at present. She generally feels fairly well. She reports sleeping fairly well. She does not have additional problems to discuss today.    She does still try to do her physical therapy stretches that she learned.  She says most likely she will have cataract surgery soon.  She does have a diagnosis of glaucoma she says her last eye exam was in January  She still just feels down and not motivated no thoughts of wanting to harm herself.  PHQ-9 score of 16 and GAD-7 score of 9 today.  The home assessment with her insurance plan for Medicare and brought in a copy of the paperwork today and also brought in a handicap placard form for us  to complete.  Her mammogram has been scheduled for today.  Most recent fall risk assessment:    10/06/2023   10:02 AM  Fall Risk   Falls in the past year? 0  Number falls in past yr: 0  Injury with Fall? 0  Risk for fall due to : No Fall Risks  Follow up Falls evaluation completed     Most recent depression screenings:    10/06/2023   12:04 PM 09/01/2022    9:46 AM  PHQ 2/9 Scores  PHQ - 2 Score 2 2  PHQ- 9 Score 14 10        Patient Care Team: Cydney Draft, MD as PCP - General (Family Medicine)   Outpatient Medications Prior to Visit  Medication Sig Note  . fluticasone  (FLONASE ) 50 MCG/ACT nasal spray SPRAY 2 SPRAYS INTO EACH NOSTRIL EVERY DAY   . hydrochlorothiazide  (MICROZIDE ) 12.5 MG capsule Take 1 capsule (12.5 mg total) by mouth daily.   . [DISCONTINUED] DULoxetine  (CYMBALTA ) 30 MG capsule Take 1 capsule (30 mg total) by mouth daily. 10/06/2023: NIghtmares   No  facility-administered medications prior to visit.    ROS        Objective:     BP 127/86   Pulse (!) 40   Ht 5\' 4"  (1.626 m)   Wt 299 lb (135.6 kg)   SpO2 95%   BMI 51.32 kg/m    Physical Exam Constitutional:      Appearance: Normal appearance.  HENT:     Head: Normocephalic and atraumatic.     Right Ear: Tympanic membrane, ear canal and external ear normal.     Left Ear: Tympanic membrane, ear canal and external ear normal.     Nose: Nose normal.     Mouth/Throat:     Pharynx: Oropharynx is clear.  Eyes:     Extraocular Movements: Extraocular movements intact.     Conjunctiva/sclera: Conjunctivae normal.     Pupils: Pupils are equal, round, and reactive to light.  Neck:     Thyroid : No thyromegaly.  Cardiovascular:     Rate and Rhythm: Normal rate and regular rhythm.  Pulmonary:     Effort: Pulmonary effort is normal.     Breath sounds: Normal breath sounds.  Abdominal:     General: Bowel sounds are normal.     Palpations: Abdomen is soft.  Tenderness: There is no abdominal tenderness.  Musculoskeletal:        General: No swelling.     Cervical back: Neck supple.  Skin:    General: Skin is warm and dry.  Neurological:     Mental Status: She is oriented to person, place, and time.  Psychiatric:        Mood and Affect: Mood normal.        Behavior: Behavior normal.     No results found for any visits on 10/06/23.     Assessment & Plan:    Routine Health Maintenance and Physical Exam  Immunization History  Administered Date(s) Administered  . Fluad Quad(high Dose 65+) 03/03/2021, 03/04/2022  . Influenza Split 04/11/2012  . Influenza Whole 03/17/2013  . Influenza, High Dose Seasonal PF 03/07/2019, 02/18/2023  . Influenza,inj,Quad PF,6+ Mos 02/16/2014, 04/01/2017, 03/03/2018, 04/18/2018  . Influenza-Unspecified 04/08/2015, 03/18/2016, 04/01/2017, 05/15/2020  . Moderna Covid-19 Fall Seasonal Vaccine 76yrs & older 02/18/2023  . Moderna Covid-19  Vaccine Bivalent Booster 71yrs & up 05/01/2021  . Moderna SARS-COV2 Booster Vaccination 11/19/2020  . Moderna Sars-Covid-2 Vaccination 08/24/2019, 09/21/2019, 05/15/2020  . PNEUMOCOCCAL CONJUGATE-20 04/04/2021  . Pfizer Covid-19 Vaccine Bivalent Booster 9yrs & up 03/26/2022  . Pneumococcal Polysaccharide-23 03/23/2019  . Respiratory Syncytial Virus Vaccine,Recomb Aduvanted(Arexvy) 03/26/2022  . Tdap 06/12/2011, 04/04/2021  . Zoster Recombinant(Shingrix) 09/04/2021, 09/04/2022    Health Maintenance  Topic Date Due  . Medicare Annual Wellness (AWV)  12/04/2022  . COVID-19 Vaccine (8 - 2024-25 season) 08/18/2023  . INFLUENZA VACCINE  01/14/2024  . MAMMOGRAM  09/29/2024  . Colonoscopy  02/03/2027  . DTaP/Tdap/Td (3 - Td or Tdap) 04/05/2031  . Pneumonia Vaccine 63+ Years old  Completed  . DEXA SCAN  Completed  . Hepatitis C Screening  Completed  . Zoster Vaccines- Shingrix  Completed  . HPV VACCINES  Aged Out  . Meningococcal B Vaccine  Aged Out    Discussed health benefits of physical activity, and encouraged her to engage in regular exercise appropriate for her age and condition.  Problem List Items Addressed This Visit       Cardiovascular and Mediastinum   Essential hypertension, benign   Relevant Orders   CMP14+EGFR   HgB A1c   Lipid panel   Urine Microalbumin w/creat. ratio     Endocrine   IFG (impaired fasting glucose)   Due to recheck A1c today.  Lab Results  Component Value Date   HGBA1C 5.1 03/04/2023         Relevant Orders   CMP14+EGFR   HgB A1c   Lipid panel   Urine Microalbumin w/creat. ratio     Genitourinary   Stage 3a chronic kidney disease (HCC)   Relevant Orders   CMP14+EGFR   HgB A1c   Lipid panel   Urine Microalbumin w/creat. ratio     Other   Severe obesity (BMI >= 40) (HCC)   Relevant Orders   CMP14+EGFR   HgB A1c   Lipid panel   Urine Microalbumin w/creat. ratio   Depression, major, recurrent (HCC)   Unfortunately she had  nightmares with the Cymbalta  so she stopped it.  We did discuss that we could consider alternatives encouraged her to think about it and let me know.  Also encouraged her to try to increase her activity level and get out with her sister little bit more.      Other Visit Diagnoses       Wellness examination    -  Primary  Keep up a regular exercise program and make sure you are eating a healthy diet Try to eat 4 servings of dairy a day, or if you are lactose intolerant take a calcium with vitamin D  daily.  Your vaccines are up to date.   Return in about 6 months (around 04/06/2024) for Hypertension, Pre-diabetes.       Duaine German, MD

## 2023-10-06 NOTE — Assessment & Plan Note (Signed)
 Unfortunately she had nightmares with the Cymbalta  so she stopped it.  We did discuss that we could consider alternatives encouraged her to think about it and let me know.  Also encouraged her to try to increase her activity level and get out with her sister little bit more.

## 2023-10-06 NOTE — Assessment & Plan Note (Signed)
 Due to recheck A1c today.  Lab Results  Component Value Date   HGBA1C 5.1 03/04/2023

## 2023-10-07 LAB — LIPID PANEL
Chol/HDL Ratio: 2.5 ratio (ref 0.0–4.4)
Cholesterol, Total: 183 mg/dL (ref 100–199)
HDL: 74 mg/dL (ref >39)
LDL Chol Calc (NIH): 91 mg/dL (ref 0–99)
Triglycerides: 104 mg/dL (ref 0–149)
VLDL Cholesterol Cal: 18 mg/dL (ref 5–40)

## 2023-10-07 LAB — CMP14+EGFR
ALT: 18 IU/L (ref 0–32)
AST: 23 IU/L (ref 0–40)
Albumin: 4.5 g/dL (ref 3.8–4.8)
Alkaline Phosphatase: 63 IU/L (ref 44–121)
BUN/Creatinine Ratio: 11 — ABNORMAL LOW (ref 12–28)
BUN: 10 mg/dL (ref 8–27)
Bilirubin Total: 0.5 mg/dL (ref 0.0–1.2)
CO2: 22 mmol/L (ref 20–29)
Calcium: 10.1 mg/dL (ref 8.7–10.3)
Chloride: 101 mmol/L (ref 96–106)
Creatinine, Ser: 0.94 mg/dL (ref 0.57–1.00)
Globulin, Total: 2.9 g/dL (ref 1.5–4.5)
Glucose: 97 mg/dL (ref 70–99)
Potassium: 3.8 mmol/L (ref 3.5–5.2)
Sodium: 143 mmol/L (ref 134–144)
Total Protein: 7.4 g/dL (ref 6.0–8.5)
eGFR: 65 mL/min/{1.73_m2} (ref >59)

## 2023-10-07 LAB — HEMOGLOBIN A1C
Est. average glucose Bld gHb Est-mCnc: 108 mg/dL
Hgb A1c MFr Bld: 5.4 % (ref 4.8–5.6)

## 2023-10-08 ENCOUNTER — Encounter: Payer: Self-pay | Admitting: Family Medicine

## 2023-10-08 NOTE — Progress Notes (Signed)
 Please call patient. Normal mammogram.  Repeat in 1 year.

## 2023-10-08 NOTE — Progress Notes (Signed)
 Terri Farrell, metabolic panel looks good A1c looks great.  And cholesterol looks good.

## 2023-10-29 DIAGNOSIS — H401121 Primary open-angle glaucoma, left eye, mild stage: Secondary | ICD-10-CM | POA: Diagnosis not present

## 2023-10-31 ENCOUNTER — Other Ambulatory Visit: Payer: Self-pay | Admitting: Family Medicine

## 2024-04-06 ENCOUNTER — Ambulatory Visit (INDEPENDENT_AMBULATORY_CARE_PROVIDER_SITE_OTHER): Admitting: Family Medicine

## 2024-04-06 ENCOUNTER — Encounter: Payer: Self-pay | Admitting: Family Medicine

## 2024-04-06 VITALS — BP 126/84 | HR 51 | Ht 64.0 in | Wt 297.0 lb

## 2024-04-06 DIAGNOSIS — R202 Paresthesia of skin: Secondary | ICD-10-CM

## 2024-04-06 DIAGNOSIS — R7301 Impaired fasting glucose: Secondary | ICD-10-CM | POA: Diagnosis not present

## 2024-04-06 DIAGNOSIS — N1831 Chronic kidney disease, stage 3a: Secondary | ICD-10-CM

## 2024-04-06 DIAGNOSIS — K5909 Other constipation: Secondary | ICD-10-CM | POA: Diagnosis not present

## 2024-04-06 DIAGNOSIS — F331 Major depressive disorder, recurrent, moderate: Secondary | ICD-10-CM | POA: Diagnosis not present

## 2024-04-06 DIAGNOSIS — J302 Other seasonal allergic rhinitis: Secondary | ICD-10-CM | POA: Diagnosis not present

## 2024-04-06 DIAGNOSIS — Z6841 Body Mass Index (BMI) 40.0 and over, adult: Secondary | ICD-10-CM

## 2024-04-06 DIAGNOSIS — M5441 Lumbago with sciatica, right side: Secondary | ICD-10-CM

## 2024-04-06 DIAGNOSIS — Z23 Encounter for immunization: Secondary | ICD-10-CM

## 2024-04-06 DIAGNOSIS — M5442 Lumbago with sciatica, left side: Secondary | ICD-10-CM

## 2024-04-06 DIAGNOSIS — I10 Essential (primary) hypertension: Secondary | ICD-10-CM

## 2024-04-06 LAB — POCT GLYCOSYLATED HEMOGLOBIN (HGB A1C): Hemoglobin A1C: 5.6 % (ref 4.0–5.6)

## 2024-04-06 NOTE — Progress Notes (Signed)
 Established Patient Office Visit  Patient ID: Terri Farrell, female    DOB: 1950/07/08  Age: 73 y.o. MRN: [id number] PCP: Alvan Dorothyann BIRCH, MD  Chief Complaint  Patient presents with   Hypertension   ifg    Subjective:     HPI  Discussed the use of AI scribe software for clinical note transcription with the patient, who gave verbal consent to proceed.  History of Present Illness Terri Farrell is a 73 year old female who presents with concerns about weight management and allergies.  Weight management and limited mobility - Difficulty with weight loss attributed to chronic pain limiting ability to exercise - Engages in chair exercises and uses resistance bands - Desires to walk more but limited by mobility issues - Weight issues contribute to depressive symptoms  Lower extremity paresthesia - Tingling in feet and legs at night for the past 1-2 weeks - Symptoms last about an hour each night  Peripheral edema - Takes hydrochlorothiazide  for swelling - No significant swelling recently  Allergic rhinitis and associated symptoms - Worsening allergies with onset of fall - Loss of voice for two weeks - No relief with Claritin or Flonase  - Morning sneezing and coughing, sometimes leading to urinary incontinence  Depressive symptoms - Depression linked to limited mobility and weight issues - Completed a nine-week therapy program including breathing exercises and depression management strategies, which were helpful - Allergies exacerbate depressive symptoms  Gastrointestinal symptoms - Diet includes good breakfast and minimal dinner, with protein sources such as cottage cheese, [place] yogurt, and [place] - Avoids nuts due to stomach pain - Alternating diarrhea and constipation - Uses a stool softener   {History (Optional):23778}  ROS    Objective:     BP 126/84   Pulse (!) 51   Ht 5' 4 (1.626 m)   Wt 297 lb (134.7 kg)   SpO2 95%   BMI 50.98 kg/m   {Vitals History (Optional):23777}  Physical Exam Vitals and nursing note reviewed.  Constitutional:      Appearance: Normal appearance.  HENT:     Head: Normocephalic and atraumatic.  Eyes:     Conjunctiva/sclera: Conjunctivae normal.  Cardiovascular:     Rate and Rhythm: Normal rate and regular rhythm.  Pulmonary:     Effort: Pulmonary effort is normal.     Breath sounds: Normal breath sounds.  Skin:    General: Skin is warm and dry.  Neurological:     Mental Status: She is alert.  Psychiatric:        Mood and Affect: Mood normal.     {PhysExam Abridge (Optional):210964309} Results for orders placed or performed in visit on 04/06/24  POCT HgB A1C  Result Value Ref Range   Hemoglobin A1C 5.6 4.0 - 5.6 %   HbA1c POC (<> result, manual entry)     HbA1c, POC (prediabetic range)     HbA1c, POC (controlled diabetic range)      {Labs (Optional):23779}  The 10-year ASCVD risk score (Arnett DK, et al., 2019) is: 11.8%    Assessment & Plan:   Problem List Items Addressed This Visit       Cardiovascular and Mediastinum   Essential hypertension, benign   Relevant Orders   Urine Microalbumin w/creat. ratio   CMP14+EGFR     Endocrine   IFG (impaired fasting glucose) - Primary   Relevant Orders   Urine Microalbumin w/creat. ratio   CMP14+EGFR   POCT HgB A1C (Completed)     Genitourinary  Stage 3a chronic kidney disease (HCC)   Relevant Orders   Urine Microalbumin w/creat. ratio   CMP14+EGFR     Other   Severe obesity (BMI >= 40) (HCC)   Relevant Orders   Urine Microalbumin w/creat. ratio   CMP14+EGFR   MDD (major depressive disorder), recurrent episode, moderate (HCC)   Other Visit Diagnoses       Encounter for immunization       Relevant Orders   Pfizer Comirnaty Covid-19 Vaccine 59yrs & older (Completed)     Encounter for immunization       Relevant Orders   Flu vaccine HIGH DOSE PF(Fluzone Trivalent) (Completed)       Assessment and  Plan Assessment & Plan Essential hypertension Blood pressure well-managed with current medication. No issues with hydrochlorothiazide . - Continue hydrochlorothiazide  as prescribed.  Morbid obesity Weight management challenging due to limited mobility and chronic pain. Physical therapy discussed but cost is a barrier. - Encourage continuation of chair exercises and use of resistance bands.  Depression Depression linked to limited mobility and weight concerns. Completed counseling program with beneficial strategies. Prefers non-pharmacological approaches. - Encourage continuation of learned strategies from counseling sessions.  Impaired fasting glucose A1c well-controlled at 5.6, indicating good glucose management.  Allergic rhinitis Persistent symptoms not well-controlled with Claritin or Flonase . Discussed switching to Zyrtec or Allegra. - Switch to plain Zyrtec or Allegra for allergy management.  Chronic pain with impaired mobility Chronic pain impacts mobility and weight management. Positive balance assessment results. - Encourage continuation of chair exercises and balance techniques.  Peripheral paresthesias (feet and legs) Recent onset of tingling in feet and legs, possibly related to circulation or low iron levels. - Check iron levels to assess for deficiency.  Chronic constipation and diarrhea Alternating constipation and diarrhea. Uses stool softener as needed. - Continue use of stool softener as needed. - Consider use of castor oil for constipation management.  General Health Maintenance Received flu and COVID vaccinations. Discussed importance of balanced diet and exercise within limitations. - Encourage balanced diet with adequate protein intake. - Encourage regular physical activity within limitations.    Return in about 6 months (around 10/05/2024) for Hypertension.    Dorothyann Byars, MD [facility] at [facility]

## 2024-04-07 ENCOUNTER — Ambulatory Visit: Payer: Self-pay | Admitting: Family Medicine

## 2024-04-07 ENCOUNTER — Encounter: Payer: Self-pay | Admitting: Family Medicine

## 2024-04-07 LAB — MICROALBUMIN / CREATININE URINE RATIO
Creatinine, Urine: 156.6 mg/dL
Microalb/Creat Ratio: 8 mg/g{creat} (ref 0–29)
Microalbumin, Urine: 13 ug/mL

## 2024-04-07 LAB — CMP14+EGFR
ALT: 16 IU/L (ref 0–32)
AST: 22 IU/L (ref 0–40)
Albumin: 4.3 g/dL (ref 3.8–4.8)
Alkaline Phosphatase: 62 IU/L (ref 49–135)
BUN/Creatinine Ratio: 13 (ref 12–28)
BUN: 14 mg/dL (ref 8–27)
Bilirubin Total: 0.5 mg/dL (ref 0.0–1.2)
CO2: 19 mmol/L — ABNORMAL LOW (ref 20–29)
Calcium: 10.4 mg/dL — ABNORMAL HIGH (ref 8.7–10.3)
Chloride: 101 mmol/L (ref 96–106)
Creatinine, Ser: 1.04 mg/dL — ABNORMAL HIGH (ref 0.57–1.00)
Globulin, Total: 3.1 g/dL (ref 1.5–4.5)
Glucose: 90 mg/dL (ref 70–99)
Potassium: 3.6 mmol/L (ref 3.5–5.2)
Sodium: 142 mmol/L (ref 134–144)
Total Protein: 7.4 g/dL (ref 6.0–8.5)
eGFR: 57 mL/min/1.73 — ABNORMAL LOW (ref >59)

## 2024-04-07 NOTE — Assessment & Plan Note (Signed)
 Will get updated renal function with labs today.

## 2024-04-07 NOTE — Assessment & Plan Note (Signed)
 Chronic constipation and diarrhea Alternating constipation and diarrhea. Uses stool softener as needed. - Continue use of stool softener as needed. - Consider use of castor oil for constipation management.

## 2024-04-07 NOTE — Assessment & Plan Note (Signed)
 Morbid obesity Weight management challenging due to limited mobility and chronic pain. Physical therapy discussed but cost is a barrier. - Encourage continuation of chair exercises and use of resistance bands.

## 2024-04-07 NOTE — Assessment & Plan Note (Signed)
 Depression Depression linked to limited mobility and weight concerns. Completed counseling program with beneficial strategies. Prefers non-pharmacological approaches. - Encourage continuation of learned strategies from counseling sessions.

## 2024-04-07 NOTE — Assessment & Plan Note (Signed)
 Essential hypertension Blood pressure well-managed with current medication. No issues with hydrochlorothiazide . - Continue hydrochlorothiazide  as prescribed.

## 2024-04-07 NOTE — Assessment & Plan Note (Signed)
 Chronic back and hip pain with impaired mobility Chronic pain impacts mobility and weight management. Positive balance assessment results. - Encourage continuation of chair exercises and balance techniques.

## 2024-04-07 NOTE — Assessment & Plan Note (Signed)
  Impaired fasting glucose A1c well-controlled at 5.6, indicating good glucose management.

## 2024-04-07 NOTE — Progress Notes (Signed)
 Hi Lorenda, kidney function is stable at 1.0.  Calcium level up just a little bit if you are taking a supplement that could explain it.  No excess protein in the urine which is great.

## 2024-05-16 ENCOUNTER — Ambulatory Visit

## 2024-05-24 ENCOUNTER — Ambulatory Visit

## 2024-05-24 VITALS — BP 145/95 | HR 88 | Ht 64.0 in | Wt 300.0 lb

## 2024-05-24 DIAGNOSIS — Z0000 Encounter for general adult medical examination without abnormal findings: Secondary | ICD-10-CM

## 2024-05-24 NOTE — Progress Notes (Signed)
 Chief Complaint  Patient presents with   Medicare Wellness     Subjective:   Terri Farrell is a 73 y.o. female who presents for a Medicare Annual Wellness Visit.  Visit info / Clinical Intake: Medicare Wellness Visit Type:: Subsequent Annual Wellness Visit Persons participating in visit and providing information:: patient Medicare Wellness Visit Mode:: In-person (required for WTM) Interpreter Needed?: No Pre-visit prep was completed: yes AWV questionnaire completed by patient prior to visit?: no Living arrangements:: (!) lives alone Patient's Overall Health Status Rating: good Typical amount of pain: some Does pain affect daily life?: (!) yes Are you currently prescribed opioids?: no  Dietary Habits and Nutritional Risks How many meals a day?: 2 Eats fruit and vegetables daily?: (!) no Most meals are obtained by: preparing own meals In the last 2 weeks, have you had any of the following?: none Diabetic:: no  Functional Status Activities of Daily Living (to include ambulation/medication): Independent Ambulation: Independent with device- listed below Home Assistive Devices/Equipment: Cane Medication Administration: Independent Home Management (perform basic housework or laundry): Independent Manage your own finances?: yes Primary transportation is: driving Concerns about vision?: no *vision screening is required for WTM* Concerns about hearing?: no  Fall Screening Falls in the past year?: 0 Number of falls in past year: 0 Was there an injury with Fall?: 0 Fall Risk Category Calculator: 0 Patient Fall Risk Level: Low Fall Risk  Fall Risk Patient at Risk for Falls Due to: Impaired mobility Fall risk Follow up: Falls evaluation completed  Home and Transportation Safety: All rugs have non-skid backing?: (!) no All stairs or steps have railings?: yes Grab bars in the bathtub or shower?: yes Have non-skid surface in bathtub or shower?: yes Good home lighting?:  yes Regular seat belt use?: yes Hospital stays in the last year:: no  Cognitive Assessment Difficulty concentrating, remembering, or making decisions? : no Will 6CIT or Mini Cog be Completed: yes What year is it?: 0 points What month is it?: 0 points Give patient an address phrase to remember (5 components): [address] [place], [place] [address] About what time is it?: 0 points Count backwards from 20 to 1: 0 points Say the months of the year in reverse: 0 points Repeat the address phrase from earlier: 2 points 6 CIT Score: 2 points  Advance Directives (For Healthcare) Does Patient Have a Medical Advance Directive?: No Would patient like information on creating a medical advance directive?: No - Patient declined  Reviewed/Updated  Reviewed/Updated: Medical History; Surgical History; Medications; Allergies; Care Teams; Patient Goals    Allergies (verified) Aspirin, Lisinopril , Cymbalta  [duloxetine  hcl], Losartan , Olmesartan , Sertraline , and Wellbutrin  [bupropion ]   Current Medications (verified) Outpatient Encounter Medications as of 05/24/2024  Medication Sig   fluticasone  (FLONASE ) 50 MCG/ACT nasal spray SPRAY 2 SPRAYS INTO EACH NOSTRIL EVERY DAY   hydrochlorothiazide  (MICROZIDE ) 12.5 MG capsule TAKE 1 CAPSULE BY MOUTH EVERY DAY   No facility-administered encounter medications on file as of 05/24/2024.    History: Past Medical History:  Diagnosis Date   Arthritis    RA   Depression    Past Surgical History:  Procedure Laterality Date   ABDOMINAL HYSTERECTOMY  06/15/1994   for fibroids   CARPAL TUNNEL RELEASE     right and left   CESAREAN SECTION     X 2   MULTIPLE TOOTH EXTRACTIONS  06/2021   had all of her teeth removed. now wears dentures   TOTAL KNEE ARTHROPLASTY  08/14/2010   Family History  Problem Relation Age of Onset   Diabetes Other        family hx of   Breast cancer Paternal Aunt    Social History   Occupational History   Occupation: clinical cytogeneticist    Comment: retired  Tobacco Use   Smoking status: Former    Current packs/day: 1.00    Average packs/day: 1 pack/day for 2.0 years (2.0 ttl pk-yrs)    Types: Cigarettes   Smokeless tobacco: Never  Vaping Use   Vaping status: Never Used  Substance and Sexual Activity   Alcohol use: No   Drug use: No   Sexual activity: Not Currently   Tobacco Counseling Counseling given: Not Answered  SDOH Screenings   Food Insecurity: No Food Insecurity (05/24/2024)  Housing: Low Risk  (05/24/2024)  Transportation Needs: No Transportation Needs (05/24/2024)  Utilities: Not At Risk (05/24/2024)  Alcohol Screen: Low Risk  (10/06/2023)  Depression (PHQ2-9): Low Risk  (05/24/2024)  Recent Concern: Depression (PHQ2-9) - Medium Risk (04/06/2024)  Financial Resource Strain: Low Risk  (10/06/2023)  Physical Activity: Insufficiently Active (05/24/2024)  Social Connections: Moderately Integrated (05/24/2024)  Stress: No Stress Concern Present (05/24/2024)  Tobacco Use: Medium Risk (05/24/2024)  Health Literacy: Adequate Health Literacy (05/24/2024)   See flowsheets for full screening details  Depression Screen PHQ 2 & 9 Depression Scale- Over the past 2 weeks, how often have you been bothered by any of the following problems? Little interest or pleasure in doing things: 0 Feeling down, depressed, or hopeless (PHQ Adolescent also includes...irritable): 0 PHQ-2 Total Score: 0 Trouble falling or staying asleep, or sleeping too much: 0 Feeling tired or having little energy: 2 Poor appetite or overeating (PHQ Adolescent also includes...weight loss): 2 Feeling bad about yourself - or that you are a failure or have let yourself or your family down: 2 Trouble concentrating on things, such as reading the newspaper or watching television (PHQ Adolescent also includes...like school work): 0 Moving or speaking so slowly that other people could have noticed. Or the opposite - being so fidgety or  restless that you have been moving around a lot more than usual: 0 Thoughts that you would be better off dead, or of hurting yourself in some way: 0 PHQ-9 Total Score: 7 If you checked off any problems, how difficult have these problems made it for you to do your work, take care of things at home, or get along with other people?: Very difficult  Depression Treatment Depression Interventions/Treatment : Counseling     Goals Addressed             This Visit's Progress    Patient Stated       Patient states she would like to lose weight and exercise.              Objective:    Today's Vitals   05/24/24 1017 05/24/24 1042  BP: (!) 156/78 (!) 145/95  Pulse: 88   SpO2: 95%   Weight: 300 lb (136.1 kg)   Height: 5' 4 (1.626 m)    Body mass index is 51.49 kg/m.  Hearing/Vision screen No results found. Immunizations and Health Maintenance Health Maintenance  Topic Date Due   COVID-19 Vaccine (9 - 2025-26 season) 10/05/2024   Medicare Annual Wellness (AWV)  05/24/2025   Mammogram  10/05/2025   Colonoscopy  02/03/2027   DTaP/Tdap/Td (3 - Td or Tdap) 04/05/2031   Pneumococcal Vaccine: 50+ Years  Completed   Influenza Vaccine  Completed   Bone  Density Scan  Completed   Hepatitis C Screening  Completed   Zoster Vaccines- Shingrix  Completed   Meningococcal B Vaccine  Aged Out        Assessment/Plan:  This is a routine wellness examination for Terri Farrell.  Patient Care Team: Alvan Dorothyann BIRCH, MD as PCP - General (Family Medicine)  I have personally reviewed and noted the following in the patients chart:   Medical and social history Use of alcohol, tobacco or illicit drugs  Current medications and supplements including opioid prescriptions. Functional ability and status Nutritional status Physical activity Advanced directives List of other physicians Hospitalizations, surgeries, and ER visits in previous 12 months Vitals Screenings to include cognitive,  depression, and falls Referrals and appointments  No orders of the defined types were placed in this encounter.  In addition, I have reviewed and discussed with patient certain preventive protocols, quality metrics, and best practice recommendations. A written personalized care plan for preventive services as well as general preventive health recommendations were provided to patient.   Bonny Jon Mayor, CMA   05/24/2024   Return in 1 year (on 05/24/2025).  After Visit Summary: (In Person-Printed) AVS printed and given to the patient  Nurse Notes:   Terri Farrell is a 73 y.o. female patient of Dorothyann Alvan, MD who had a Medicare Annual Wellness Visit today. Terri Farrell lives alone. She reports that she is socially active and does interact with friends/family regularly. She is minimally physically active.

## 2024-05-24 NOTE — Patient Instructions (Signed)
 Terri Farrell,  Thank you for taking the time for your Medicare Wellness Visit. I appreciate your continued commitment to your health goals. Please review the care plan we discussed, and feel free to reach out if I can assist you further.  Please note that Annual Wellness Visits do not include a physical exam. Some assessments may be limited, especially if the visit was conducted virtually. If needed, we may recommend an in-person follow-up with your provider.  Ongoing Care Seeing your primary care provider every 3 to 6 months helps us  monitor your health and provide consistent, personalized care.   Referrals If a referral was made during today's visit and you haven't received any updates within two weeks, please contact the referred provider directly to check on the status.  Recommended Screenings:  Health Maintenance  Topic Date Due   Medicare Annual Wellness Visit  12/04/2022   COVID-19 Vaccine (9 - 2025-26 season) 10/05/2024   Breast Cancer Screening  10/05/2025   Colon Cancer Screening  02/03/2027   DTaP/Tdap/Td vaccine (3 - Td or Tdap) 04/05/2031   Pneumococcal Vaccine for age over 52  Completed   Flu Shot  Completed   Osteoporosis screening with Bone Density Scan  Completed   Hepatitis C Screening  Completed   Zoster (Shingles) Vaccine  Completed   Meningitis B Vaccine  Aged Out       05/24/2024   10:26 AM  Advanced Directives  Does Patient Have a Medical Advance Directive? No  Would patient like information on creating a medical advance directive? No - Patient declined    Vision: Annual vision screenings are recommended for early detection of glaucoma, cataracts, and diabetic retinopathy. These exams can also reveal signs of chronic conditions such as diabetes and high blood pressure.  Dental: Annual dental screenings help detect early signs of oral cancer, gum disease, and other conditions linked to overall health, including heart disease and diabetes.  Please see the  attached documents for additional preventive care recommendations.

## 2024-10-05 ENCOUNTER — Ambulatory Visit: Admitting: Family Medicine

## 2025-04-17 ENCOUNTER — Ambulatory Visit
